# Patient Record
Sex: Female | Born: 1995 | Race: Black or African American | Hispanic: No | Marital: Married
Health system: Midwestern US, Community
[De-identification: ages and names within clinical notes are randomized; demographics above are authoritative.]

## PROBLEM LIST (undated history)

## (undated) ENCOUNTER — Inpatient Hospital Stay (HOSPITAL_COMMUNITY): Payer: Self-pay

## (undated) DIAGNOSIS — K219 Gastro-esophageal reflux disease without esophagitis: Secondary | ICD-10-CM

## (undated) DIAGNOSIS — D649 Anemia, unspecified: Secondary | ICD-10-CM

## (undated) DIAGNOSIS — F419 Anxiety disorder, unspecified: Secondary | ICD-10-CM

## (undated) DIAGNOSIS — N83209 Unspecified ovarian cyst, unspecified side: Secondary | ICD-10-CM

## (undated) HISTORY — PX: WISDOM TOOTH EXTRACTION: SHX21

---
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
## 2015-05-03 ENCOUNTER — Emergency Department (HOSPITAL_BASED_OUTPATIENT_CLINIC_OR_DEPARTMENT_OTHER)
Admission: EM | Admit: 2015-05-03 | Discharge: 2015-05-03 | Disposition: A | Discharge status: Home / Self Care | Payer: Medicaid Other | Attending: Emergency Medicine | Admitting: Emergency Medicine

## 2015-05-03 ENCOUNTER — Encounter (HOSPITAL_BASED_OUTPATIENT_CLINIC_OR_DEPARTMENT_OTHER): Payer: Self-pay | Admitting: *Deleted

## 2015-05-03 ENCOUNTER — Emergency Department (HOSPITAL_BASED_OUTPATIENT_CLINIC_OR_DEPARTMENT_OTHER): Payer: Medicaid Other

## 2015-05-03 DIAGNOSIS — F410 Panic disorder [episodic paroxysmal anxiety] without agoraphobia: Secondary | ICD-10-CM | POA: Diagnosis not present

## 2015-05-03 DIAGNOSIS — Z79899 Other long term (current) drug therapy: Secondary | ICD-10-CM | POA: Insufficient documentation

## 2015-05-03 DIAGNOSIS — Z720 Tobacco use: Secondary | ICD-10-CM | POA: Diagnosis not present

## 2015-05-03 DIAGNOSIS — R52 Pain, unspecified: Secondary | ICD-10-CM

## 2015-05-03 DIAGNOSIS — R079 Chest pain, unspecified: Secondary | ICD-10-CM | POA: Diagnosis present

## 2015-05-03 LAB — BASIC METABOLIC PANEL
Anion gap: 7 (ref 5–15)
BUN: 10 mg/dL (ref 6–20)
CALCIUM: 8.7 mg/dL — AB (ref 8.9–10.3)
CO2: 25 mmol/L (ref 22–32)
CREATININE: 0.71 mg/dL (ref 0.44–1.00)
Chloride: 106 mmol/L (ref 101–111)
GFR calc non Af Amer: >60 mL/min (ref >60)
GLUCOSE: 89 mg/dL (ref 65–99)
Potassium: 3.8 mmol/L (ref 3.5–5.1)
Sodium: 138 mmol/L (ref 135–145)

## 2015-05-03 LAB — CBC WITH DIFFERENTIAL/PLATELET
BASOS ABS: 0 10*3/uL (ref 0.0–0.1)
Basophils Relative: 0 % (ref 0–1)
EOS ABS: 0.1 10*3/uL (ref 0.0–0.7)
Eosinophils Relative: 1 % (ref 0–5)
HCT: 36.9 % (ref 36.0–46.0)
Hemoglobin: 11.8 g/dL — ABNORMAL LOW (ref 12.0–15.0)
LYMPHS ABS: 1.3 10*3/uL (ref 0.7–4.0)
Lymphocytes Relative: 27 % (ref 12–46)
MCH: 28.8 pg (ref 26.0–34.0)
MCHC: 32 g/dL (ref 30.0–36.0)
MCV: 90 fL (ref 78.0–100.0)
Monocytes Absolute: 0.4 10*3/uL (ref 0.1–1.0)
Monocytes Relative: 7 % (ref 3–12)
NEUTROS ABS: 3.2 10*3/uL (ref 1.7–7.7)
NEUTROS PCT: 65 % (ref 43–77)
PLATELETS: 219 10*3/uL (ref 150–400)
RBC: 4.1 MIL/uL (ref 3.87–5.11)
RDW: 13.3 % (ref 11.5–15.5)
WBC: 4.9 10*3/uL (ref 4.0–10.5)

## 2015-05-03 LAB — PREGNANCY, URINE: Preg Test, Ur: NEGATIVE

## 2015-05-03 MED ORDER — HYDROXYZINE HCL 25 MG PO TABS
50.0000 mg | ORAL_TABLET | Freq: Once | ORAL | Status: AC
  Administered 2015-05-03: 50 mg via ORAL
  Filled 2015-05-03: qty 2

## 2015-05-03 MED ORDER — HYDROXYZINE HCL 25 MG PO TABS: 25.0000 mg | ORAL_TABLET | Freq: Four times a day (QID) | ORAL | Status: DC

## 2015-05-03 NOTE — Discharge Instructions (Signed)
Please follow with your primary care doctor in the next 2 days for a check-up. They must obtain records for further management.  ° °Do not hesitate to return to the Emergency Department for any new, worsening or concerning symptoms.  ° °

## 2015-05-03 NOTE — ED Provider Notes (Signed)
CSN: [id number]     Arrival date & time 05/03/15  1445 History   First MD Initiated Contact with Patient 05/03/15 1607     Chief Complaint  Patient presents with  . Panic Attack     (Consider location/radiation/quality/duration/timing/severity/associated sxs/prior Treatment) HPI  Blood pressure 103/74, pulse 72, temperature 98.5 F (36.9 C), temperature source Oral, resp. rate 18, height  (1.651 m), weight 125 lb (56.7 kg), last menstrual period 04/29/2015, SpO2 100 %.  Natasha Good is a 19 y.o. female complaining of chest pain and shortness of breath, sense of impending doom onset 2 hours ago, this was unprovoked similar to prior panic attacks. Patient states that she's had these and has not discussed them with her primary care doctor, she's not taking any medications for it. She states that she is not any more particular stress today than any other day. She denies depression, suicidal ideation, homicidal ideation, drug abuse. She states that the shortness of breath has resolved however, the chest pain continues, its 5 out of 10, retrosternal and nonradiating. Pt denies fever, family history of early cardiac death h/o DVT/PE, calf pain or leg swelling, hemoptysis, recent immobilization, cancer/chemotherapy in the last 6 months, exogenous estrogen (chart says she takes a depo shot but patient states she has not had this in over a year.). On review of systems she endorses a dry cough.   History reviewed. No pertinent past medical history. History reviewed. No pertinent past surgical history. Family History  Problem Relation Age of Onset  . Heart disease Father    History  Substance Use Topics  . Smoking status: Never Smoker   . Smokeless tobacco: Never Used  . Alcohol Use: No   OB History    No data available     Review of Systems    Allergies  Review of patient's allergies indicates no known allergies.  Home Medications   Prior to Admission medications   Medication  Sig Start Date End Date Taking? Authorizing Provider  acetaminophen (TYLENOL) 500 MG tablet Take 500 mg by mouth every 6 (six) hours as needed.    Historical Provider, MD  hydrOXYzine (ATARAX/VISTARIL) 25 MG tablet Take 1 tablet (25 mg total) by mouth every 6 (six) hours. 05/03/15   Natasha Bonczek, PA-C  medroxyPROGESTERone (DEPO-PROVERA) 150 MG/ML injection Inject 1 mL (150 mg total) into the muscle every 3 (three) months. 04/24/13   Natasha Mocha, MD  ondansetron (ZOFRAN-ODT) 4 MG disintegrating tablet Take 1 tablet (4 mg total) by mouth every 6 (six) hours as needed for nausea or vomiting. 01/25/14   Natasha Foster, NP  polyethylene glycol powder (GLYCOLAX/MIRALAX) powder 17g in 8 ounces of clear liquids PO QHS x 1 week then PRN 01/25/14   Natasha Foster, NP  Skin Protectants, Misc. (A+D FIRST AID) OINT Apply 1 application topically daily. Apply to tattoo daily    Historical Provider, MD   BP 115/71 mmHg  Pulse 82  Temp(Src) 98.5 F (36.9 C) (Oral)  Resp 16  Ht  (1.651 m)  Wt 125 lb (56.7 kg)  BMI 20.80 kg/m2  SpO2 100%  LMP 04/29/2015 Physical Exam  Constitutional: She is oriented to person, place, and time. She appears well-developed and well-nourished. No distress.  HENT:  Head: Normocephalic.  Mouth/Throat: Oropharynx is clear and moist.  Eyes: Conjunctivae are normal.  Neck: Normal range of motion. No JVD present. No tracheal deviation present.  Cardiovascular: Normal rate, regular rhythm and intact distal pulses.   Radial pulse equal  bilaterally  Pulmonary/Chest: Effort normal and breath sounds normal. No stridor. No respiratory distress. She has no wheezes. She has no rales. She exhibits no tenderness.  Abdominal: Soft. She exhibits no distension and no mass. There is no tenderness. There is no rebound and no guarding.  Musculoskeletal: Normal range of motion. She exhibits no edema or tenderness.  No calf asymmetry, superficial collaterals, palpable cords, edema, Homans sign  negative bilaterally.    Neurological: She is alert and oriented to person, place, and time.  Skin: Skin is warm. She is not diaphoretic.  Psychiatric:  Calm, tearful  Nursing note and vitals reviewed.   ED Course  Procedures (including critical care time) Labs Review Labs Reviewed  CBC WITH DIFFERENTIAL/PLATELET - Abnormal; Notable for the following:    Hemoglobin 11.8 (*)    All other components within normal limits  BASIC METABOLIC PANEL - Abnormal; Notable for the following:    Calcium 8.7 (*)    All other components within normal limits  PREGNANCY, URINE    Imaging Review Dg Chest 2 View  05/03/2015   CLINICAL DATA:  19 year old female with panic attack  EXAM: CHEST  2 VIEW  COMPARISON:  Radiograph dated 05/09/2014  FINDINGS: The heart size and mediastinal contours are within normal limits. Both lungs are clear. The visualized skeletal structures are unremarkable.  IMPRESSION: No active cardiopulmonary disease.   Electronically Signed   By: Natasha CollardArash  Good M.D.   On: 05/03/2015 17:33     EKG Interpretation   Date/Time:  Sunday May 03 2015 16:38:26 EDT Ventricular Rate:  72 PR Interval:  164 QRS Duration: 72 QT Interval:  392 QTC Calculation: 429 R Axis:   75 Text Interpretation:  Normal sinus rhythm with sinus arrhythmia Normal ECG  No significant change since last tracing Confirmed by Natasha ChickJACUBOWITZ  MD, Natasha  [phone]([id number]) on 05/03/2015 4:40:38 PM      MDM   Final diagnoses:  Pain  Panic attack    Filed Vitals:   05/03/15 1504 05/03/15 1750  BP: 103/74 115/71  Pulse: 72 82  Temp: 98.5 F (36.9 C)   TempSrc: Oral   Resp: 18 16  Height: 5\' 5"  (1.651 m)   Weight: 125 lb (56.7 kg)   SpO2: 100% 100%    Medications  hydrOXYzine (ATARAX/VISTARIL) tablet 50 mg (50 mg Oral Given 05/03/15 1644)    Natasha Good is a pleasant 19 y.o. female presenting with chest pain shortness of breath consistent with prior episodes of panic attack, will check basic cardiac  workup.  EKG with no arrhythmia, bloodwork does not show any anemia, chest x-rays negative, reassured patient that this is a simple anxiety attack. I encouraged her to read up on the condition and to follow closely with her PCP.  Evaluation does not show pathology that would require ongoing emergent intervention or inpatient treatment. Pt is hemodynamically stable and mentating appropriately. Discussed findings and plan with patient/guardian, who agrees with care plan. All questions answered. Return precautions discussed and outpatient follow up given.   New Prescriptions   HYDROXYZINE (ATARAX/VISTARIL) 25 MG TABLET    Take 1 tablet (25 mg total) by mouth every 6 (six) hours.         Wynetta Emeryicole Chevy Virgo, PA-C 05/03/15 1806  Doug SouSam Jacubowitz, MD 05/04/15 [phone]

## 2015-05-03 NOTE — ED Notes (Signed)
Pt reports panic attack x 2 hours- has had similar episodes in the past but has never be evaluated- no meds- denies SI/HI

## 2015-12-06 ENCOUNTER — Emergency Department (HOSPITAL_BASED_OUTPATIENT_CLINIC_OR_DEPARTMENT_OTHER)
Admission: EM | Admit: 2015-12-06 | Discharge: 2015-12-06 | Disposition: A | Discharge status: Home / Self Care | Payer: Medicaid Other | Attending: Emergency Medicine | Admitting: Emergency Medicine

## 2015-12-06 ENCOUNTER — Encounter (HOSPITAL_BASED_OUTPATIENT_CLINIC_OR_DEPARTMENT_OTHER): Payer: Self-pay | Admitting: *Deleted

## 2015-12-06 DIAGNOSIS — D649 Anemia, unspecified: Secondary | ICD-10-CM | POA: Diagnosis not present

## 2015-12-06 DIAGNOSIS — R040 Epistaxis: Secondary | ICD-10-CM | POA: Diagnosis present

## 2015-12-06 DIAGNOSIS — Z79899 Other long term (current) drug therapy: Secondary | ICD-10-CM | POA: Insufficient documentation

## 2015-12-06 HISTORY — DX: Anemia, unspecified: D64.9

## 2015-12-06 LAB — CBC WITH DIFFERENTIAL/PLATELET
BASOS ABS: 0 10*3/uL (ref 0.0–0.1)
BASOS PCT: 0 %
Eosinophils Absolute: 0.3 10*3/uL (ref 0.0–0.7)
Eosinophils Relative: 8 %
HCT: 37.9 % (ref 36.0–46.0)
HEMOGLOBIN: 11.7 g/dL — AB (ref 12.0–15.0)
Lymphocytes Relative: 45 %
Lymphs Abs: 1.8 10*3/uL (ref 0.7–4.0)
MCH: 28 pg (ref 26.0–34.0)
MCHC: 30.9 g/dL (ref 30.0–36.0)
MCV: 90.7 fL (ref 78.0–100.0)
Monocytes Absolute: 0.3 10*3/uL (ref 0.1–1.0)
Monocytes Relative: 8 %
NEUTROS PCT: 39 %
Neutro Abs: 1.6 10*3/uL — ABNORMAL LOW (ref 1.7–7.7)
Platelets: 197 10*3/uL (ref 150–400)
RBC: 4.18 MIL/uL (ref 3.87–5.11)
RDW: 12.7 % (ref 11.5–15.5)
WBC: 4.1 10*3/uL (ref 4.0–10.5)

## 2015-12-06 MED ORDER — FERROUS SULFATE 325 (65 FE) MG PO TABS: 325.0000 mg | ORAL_TABLET | ORAL | Status: DC

## 2015-12-06 NOTE — ED Provider Notes (Signed)
CSN: [id number]     Arrival date & time 12/06/15  1146 History   First MD Initiated Contact with Patient 12/06/15 1223     Chief Complaint  Patient presents with  . Epistaxis   (Consider location/radiation/quality/duration/timing/severity/associated sxs/prior Treatment) Patient is a 20 y.o. female presenting with nosebleeds. The history is provided by the patient. No language interpreter was used.  Epistaxis Associated symptoms: no fever    Miss Mihalic is a 20 year old female with a history of anemia and nosebleeds as a child who presents for 4 episodes of epistaxis in the past 3 days. She states it resolved after applying pressure and tilting her head back. She reports being dizzy while at work yesterday but says that has since resolved and she just has a headache now. Says that her mom was worried due to her history of anemia and sent her to the ED. Denies any history of bleeding disorders. Denies any heavy menstrual periods. Last menstrual period was 11/15/2015. Denies any bleeding from the gums, lightheadedness, syncope. Denies any injury to the face or use of illegal nasal drugs.   Past Medical History  Diagnosis Date  . Anemia    History reviewed. No pertinent past surgical history. Family History  Problem Relation Age of Onset  . Heart disease Father    Social History  Substance Use Topics  . Smoking status: Never Smoker   . Smokeless tobacco: Never Used  . Alcohol Use: No   OB History    No data available     Review of Systems  Constitutional: Negative for fever.  HENT: Positive for nosebleeds.   All other systems reviewed and are negative.     Allergies  Review of patient's allergies indicates no known allergies.  Home Medications   Prior to Admission medications   Medication Sig Start Date End Date Taking? Authorizing Provider  acetaminophen (TYLENOL) 500 MG tablet Take 500 mg by mouth every 6 (six) hours as needed.    Historical Provider, MD  ferrous  sulfate 325 (65 FE) MG tablet Take 1 tablet (325 mg total) by mouth every other day. 12/06/15   Imoni Kohen Patel-Mills, PA-C  hydrOXYzine (ATARAX/VISTARIL) 25 MG tablet Take 1 tablet (25 mg total) by mouth every 6 (six) hours. 05/03/15   Nicole Pisciotta, PA-C  medroxyPROGESTERone (DEPO-PROVERA) 150 MG/ML injection Inject 1 mL (150 mg total) into the muscle every 3 (three) months. 04/24/13   Sherren Mocha, MD  ondansetron (ZOFRAN-ODT) 4 MG disintegrating tablet Take 1 tablet (4 mg total) by mouth every 6 (six) hours as needed for nausea or vomiting. 01/25/14   Lowanda Foster, NP  polyethylene glycol powder (GLYCOLAX/MIRALAX) powder 17g in 8 ounces of clear liquids PO QHS x 1 week then PRN 01/25/14   Lowanda Foster, NP  Skin Protectants, Misc. (A+D FIRST AID) OINT Apply 1 application topically daily. Apply to tattoo daily    Historical Provider, MD   BP 116/77 mmHg  Pulse 66  Temp(Src) 98.5 F (36.9 C) (Oral)  Resp 16  Ht  (1.651 m)  Wt 49.896 kg  BMI 18.31 kg/m2  SpO2 100%  LMP 11/15/2015 (Approximate) Physical Exam  Constitutional: She is oriented to person, place, and time. She appears well-developed and well-nourished. No distress.  HENT:  Head: Normocephalic and atraumatic.  Patent nares with no active bleeding or dried blood. Normal septum. No bleeding in the throat.  Eyes: Conjunctivae and EOM are normal.  Neck: Normal range of motion. Neck supple.  Cardiovascular: Normal rate.  Pulmonary/Chest: Effort normal.  Musculoskeletal: Normal range of motion.  Neurological: She is alert and oriented to person, place, and time.  Skin: Skin is warm and dry.  Psychiatric: She has a normal mood and affect.  Nursing note and vitals reviewed.   ED Course  Procedures (including critical care time) Labs Review Labs Reviewed  CBC WITH DIFFERENTIAL/PLATELET - Abnormal; Notable for the following:    Hemoglobin 11.7 (*)    Neutro Abs 1.6 (*)    All other components within normal limits    Imaging  Review No results found. I have personally reviewed and evaluated these lab results as part of my medical decision-making.   EKG Interpretation None      MDM   Final diagnoses:  Anemia, unspecified anemia type   Patient presents for epistaxis 4 for the past 3 days. Able to be controlled through pressure and tilting her head. Worried that she may be anemic since she has had dizziness yesterday that has since resolved. Reports that mom sent her for further evaluation. She is well-appearing and in no acute distress. No active bleeding now. Denies heavy menstrual cycles. Vital signs stable. CBC was obtained and patient is mildly anemic with hemoglobin of 11.7. This is comparable to her previous hemoglobin level. Return precautions were discussed as well as follow-up. Patient was put on ferrous iron. Patient agrees with plan. Filed Vitals:   12/06/15 1151 12/06/15 1316  BP: 115/72 116/77  Pulse: 65 66  Temp: 98.5 F (36.9 C)   Resp: 18 [id number], PA-C 12/06/15 1519  Lyndal Pulley, MD 12/07/15 1330

## 2015-12-06 NOTE — ED Notes (Signed)
Rt and Lt nare visually examined, no blood or bleeding noted at this time, pt appears to be in no distress at this time. Color good, capillary refill WNL. Alert and oriented x 3.

## 2015-12-06 NOTE — ED Notes (Signed)
Reports nosebleed this morning which is resolved at present. States she has had 4 nosebleeds since Thursday. Also reports hx of anemia and at times her hands and feet are purple

## 2015-12-06 NOTE — ED Notes (Signed)
Pt states has been having nosebleeds since Thursday, worse yesterday, no active bleeding noted at this time. No notice of bleeding since this am

## 2015-12-06 NOTE — Discharge Instructions (Signed)
Anemia, Nonspecific Hemoglobin 11.7. Take iron every other day. This may cause constipation. In that event, take a stool softener. Follow-up with your doctor in 2 days. Anemia is a condition in which the concentration of red blood cells or hemoglobin in the blood is below normal. Hemoglobin is a substance in red blood cells that carries oxygen to the tissues of the body. Anemia results in not enough oxygen reaching these tissues.  CAUSES  Common causes of anemia include:   Excessive bleeding. Bleeding may be internal or external. This includes excessive bleeding from periods (in women) or from the intestine.   Poor nutrition.   Chronic kidney, thyroid, and liver disease.  Bone marrow disorders that decrease red blood cell production.  Cancer and treatments for cancer.  HIV, AIDS, and their treatments.  Spleen problems that increase red blood cell destruction.  Blood disorders.  Excess destruction of red blood cells due to infection, medicines, and autoimmune disorders. SIGNS AND SYMPTOMS   Minor weakness.   Dizziness.   Headache.  Palpitations.   Shortness of breath, especially with exercise.   Paleness.  Cold sensitivity.  Indigestion.  Nausea.  Difficulty sleeping.  Difficulty concentrating. Symptoms may occur suddenly or they may develop slowly.  DIAGNOSIS  Additional blood tests are often needed. These help your health care provider determine the best treatment. Your health care provider will check your stool for blood and look for other causes of blood loss.  TREATMENT  Treatment varies depending on the cause of the anemia. Treatment can include:   Supplements of iron, vitamin B12, or folic acid.   Hormone medicines.   A blood transfusion. This may be needed if blood loss is severe.   Hospitalization. This may be needed if there is significant continual blood loss.   Dietary changes.  Spleen removal. HOME CARE INSTRUCTIONS Keep all  follow-up appointments. It often takes many weeks to correct anemia, and having your health care provider check on your condition and your response to treatment is very important. SEEK IMMEDIATE MEDICAL CARE IF:   You develop extreme weakness, shortness of breath, or chest pain.   You become dizzy or have trouble concentrating.  You develop heavy vaginal bleeding.   You develop a rash.   You have bloody or black, tarry stools.   You faint.   You vomit up blood.   You vomit repeatedly.   You have abdominal pain.  You have a fever or persistent symptoms for more than 2-3 days.   You have a fever and your symptoms suddenly get worse.   You are dehydrated.  MAKE SURE YOU:  Understand these instructions.  Will watch your condition.  Will get help right away if you are not doing well or get worse.   This information is not intended to replace advice given to you by your health care provider. Make sure you discuss any questions you have with your health care provider.   Document Released: 11/10/2004 Document Revised: 06/05/2013 Document Reviewed: 03/29/2013 Elsevier Interactive Patient Education [employer]

## 2016-02-04 LAB — OB RESULTS CONSOLE GBS: STREP GROUP B AG: NEGATIVE

## 2016-02-04 LAB — OB RESULTS CONSOLE GC/CHLAMYDIA
CHLAMYDIA, DNA PROBE: NEGATIVE
Gonorrhea: NEGATIVE

## 2016-07-04 ENCOUNTER — Ambulatory Visit (INDEPENDENT_AMBULATORY_CARE_PROVIDER_SITE_OTHER): Payer: Medicaid Other | Admitting: General Practice

## 2016-07-04 ENCOUNTER — Encounter: Payer: Self-pay | Admitting: General Practice

## 2016-07-04 DIAGNOSIS — Z3201 Encounter for pregnancy test, result positive: Secondary | ICD-10-CM | POA: Diagnosis not present

## 2016-07-04 LAB — POCT PREGNANCY, URINE: Preg Test, Ur: POSITIVE — AB

## 2016-07-04 NOTE — Progress Notes (Signed)
Patient here for UPT today. UPT +. First positive home test was today. LMP sometime in mid August EDD estimate 03/07/16. Patient not currently on any meds or vitamins. Recommended she begin PNV & provided proof of pregnancy letter. Recommended she seek care for first visit in 5-6 weeks. Patient verbalized understanding to all

## 2016-07-05 ENCOUNTER — Inpatient Hospital Stay (HOSPITAL_COMMUNITY)
Admission: AD | Admit: 2016-07-05 | Discharge: 2016-07-05 | Disposition: A | Discharge status: Home / Self Care | Payer: Medicaid Other | Source: Ambulatory Visit | Attending: Obstetrics and Gynecology | Admitting: Obstetrics and Gynecology

## 2016-07-05 ENCOUNTER — Inpatient Hospital Stay (HOSPITAL_COMMUNITY): Payer: Medicaid Other

## 2016-07-05 ENCOUNTER — Encounter (HOSPITAL_COMMUNITY): Payer: Self-pay | Admitting: *Deleted

## 2016-07-05 DIAGNOSIS — O9989 Other specified diseases and conditions complicating pregnancy, childbirth and the puerperium: Secondary | ICD-10-CM

## 2016-07-05 DIAGNOSIS — R109 Unspecified abdominal pain: Secondary | ICD-10-CM

## 2016-07-05 DIAGNOSIS — O26899 Other specified pregnancy related conditions, unspecified trimester: Secondary | ICD-10-CM

## 2016-07-05 DIAGNOSIS — Z3491 Encounter for supervision of normal pregnancy, unspecified, first trimester: Secondary | ICD-10-CM

## 2016-07-05 DIAGNOSIS — R1031 Right lower quadrant pain: Secondary | ICD-10-CM | POA: Diagnosis not present

## 2016-07-05 DIAGNOSIS — Z3A01 Less than 8 weeks gestation of pregnancy: Secondary | ICD-10-CM | POA: Insufficient documentation

## 2016-07-05 DIAGNOSIS — O26891 Other specified pregnancy related conditions, first trimester: Secondary | ICD-10-CM | POA: Diagnosis not present

## 2016-07-05 HISTORY — DX: Gastro-esophageal reflux disease without esophagitis: K21.9

## 2016-07-05 HISTORY — DX: Unspecified ovarian cyst, unspecified side: N83.209

## 2016-07-05 HISTORY — DX: Anxiety disorder, unspecified: F41.9

## 2016-07-05 LAB — CBC
HEMATOCRIT: 37.7 % (ref 36.0–46.0)
HEMOGLOBIN: 12.6 g/dL (ref 12.0–15.0)
MCH: 29.9 pg (ref 26.0–34.0)
MCHC: 33.4 g/dL (ref 30.0–36.0)
MCV: 89.5 fL (ref 78.0–100.0)
Platelets: 241 10*3/uL (ref 150–400)
RBC: 4.21 MIL/uL (ref 3.87–5.11)
RDW: 14.1 % (ref 11.5–15.5)
WBC: 7.1 10*3/uL (ref 4.0–10.5)

## 2016-07-05 LAB — HCG, QUANTITATIVE, PREGNANCY: HCG, BETA CHAIN, QUANT, S: 6458 m[IU]/mL — AB (ref <5)

## 2016-07-05 LAB — WET PREP, GENITAL
Sperm: NONE SEEN
Trich, Wet Prep: NONE SEEN
WBC WET PREP: NONE SEEN
Yeast Wet Prep HPF POC: NONE SEEN

## 2016-07-05 LAB — URINALYSIS, ROUTINE W REFLEX MICROSCOPIC
Bilirubin Urine: NEGATIVE
GLUCOSE, UA: NEGATIVE mg/dL
HGB URINE DIPSTICK: NEGATIVE
Ketones, ur: NEGATIVE mg/dL
LEUKOCYTES UA: NEGATIVE
Nitrite: NEGATIVE
PH: 6 (ref 5.0–8.0)
PROTEIN: NEGATIVE mg/dL
Specific Gravity, Urine: 1.015 (ref 1.005–1.030)

## 2016-07-05 LAB — ABO/RH: ABO/RH(D): A NEG

## 2016-07-05 LAB — OB RESULTS CONSOLE GC/CHLAMYDIA: GC PROBE AMP, GENITAL: NEGATIVE

## 2016-07-05 NOTE — MAU Provider Note (Signed)
History     CSN: [id number]  Arrival date and time: 07/05/16 1107   First Provider Initiated Contact with Patient 07/05/16 1352      Chief Complaint  Patient presents with  . Abdominal Pain   HPI  Natasha Good is a 20 y.o. G1P0 at 2357w0d by LMP who presents with abdominal pain. Reports lower abdominal pain x 1 week. Pain is worse in RLQ. Comes & goes. Currently rates pain 5/10. Has been using a heating pad with minimal relief. Denies aggravating factors.  Denies vomiting, diarrhea, constipation, vaginal bleeding, fever, or dysuria. Reports some nausea. No recent intercourse.   OB History    Gravida Para Term Preterm AB Living   1         0   SAB TAB Ectopic Multiple Live Births                  Past Medical History:  Diagnosis Date  . Acid reflux   . Anemia   . Anxiety   . Ovarian cyst     Past Surgical History:  Procedure Laterality Date  . WISDOM TOOTH EXTRACTION      Family History  Problem Relation Age of Onset  . Heart disease Father     Social History  Substance Use Topics  . Smoking status: Never Smoker  . Smokeless tobacco: Never Used  . Alcohol use No    Allergies: No Known Allergies  Prescriptions Prior to Admission  Medication Sig Dispense Refill Last Dose  . acetaminophen (TYLENOL) 500 MG tablet Take 500 mg by mouth every 6 (six) hours as needed.   01/25/2014 at Unknown time  . ferrous sulfate 325 (65 FE) MG tablet Take 1 tablet (325 mg total) by mouth every other day. 30 tablet 0   . hydrOXYzine (ATARAX/VISTARIL) 25 MG tablet Take 1 tablet (25 mg total) by mouth every 6 (six) hours. 12 tablet 0   . medroxyPROGESTERone (DEPO-PROVERA) 150 MG/ML injection Inject 1 mL (150 mg total) into the muscle every 3 (three) months. 1 mL 3 Last month  . ondansetron (ZOFRAN-ODT) 4 MG disintegrating tablet Take 1 tablet (4 mg total) by mouth every 6 (six) hours as needed for nausea or vomiting. 10 tablet 0   . polyethylene glycol powder (GLYCOLAX/MIRALAX) powder  17g in 8 ounces of clear liquids PO QHS x 1 week then PRN 255 g 0   . Skin Protectants, Misc. (A+D FIRST AID) OINT Apply 1 application topically daily. Apply to tattoo daily   01/25/2014 at Unknown time    Review of Systems  Constitutional: Negative.   Gastrointestinal: Positive for abdominal pain and nausea. Negative for constipation, diarrhea and vomiting.  Genitourinary: Negative.    Physical Exam   Blood pressure 119/69, pulse 70, temperature 98.5 F (36.9 C), temperature source Oral, resp. rate 16, height 5\' 5"  (1.651 m), weight 119 lb (54 kg), last menstrual period 05/24/2016.  Physical Exam  Nursing note and vitals reviewed. Constitutional: She is oriented to person, place, and time. She appears well-developed and well-nourished. No distress.  HENT:  Head: Normocephalic and atraumatic.  Eyes: Conjunctivae are normal. Right eye exhibits no discharge. Left eye exhibits no discharge. No scleral icterus.  Neck: Normal range of motion.  Cardiovascular: Normal rate, regular rhythm and normal heart sounds.   No murmur heard. Respiratory: Effort normal and breath sounds normal. No respiratory distress. She has no wheezes.  GI: Soft. She exhibits no distension. There is no tenderness.  Genitourinary: There is no  rash or tenderness on the right labia. There is no rash or tenderness on the left labia.  Neurological: She is alert and oriented to person, place, and time.  Skin: Skin is warm and dry. She is not diaphoretic.  Psychiatric: She has a normal mood and affect. Her behavior is normal. Judgment and thought content normal.    MAU Course  Procedures Results for orders placed or performed during the hospital encounter of 07/05/16 (from the past 24 hour(s))  hCG, quantitative, pregnancy     Status: Abnormal   Collection Time: 07/05/16 11:28 AM  Result Value Ref Range   hCG, Beta Chain, Quant, S 6,458 (H) <5 mIU/mL  ABO/Rh     Status: None (Preliminary result)   Collection Time:  07/05/16  1:35 PM  Result Value Ref Range   ABO/RH(D) A NEG    US Ob Comp Less 14 Wks  Result Date: 07/05/2016 CLINICAL DATA:  Right lower quadrant/pelvic pain for 1 week EXAM: OBSTETRIC <14 WK [place] AND TRANSVAGINAL OB US TECHNIQUE: Both transabdominal and transvaginal ultrasound examinations were performed for complete evaluation of the gestation as well as the maternal uterus, adnexal regions, and pelvic cul-de-sac. Transvaginal technique was performed to assess early pregnancy. COMPARISON:  None. FINDINGS: Intrauterine gestational sac: Visualized Yolk sac:  Visualized Embryo:  Not visualized Cardiac Activity: Not visualized MSD: 6  mm   5 w   2  d Subchorionic hemorrhage:  None visualized. Maternal uterus/adnexae: Cervical os is closed. Right ovary appears normal. There is a 3.3 x 2.4 x 3.4 cm probable corpus luteum arising from the left ovary. No other extrauterine pelvic mass. A small amount of free fluid is noted in the cul-de-sac. IMPRESSION: There is a gestational sac containing a yolk sac, but no fetal pole currently visualized. Based on gestational sac size, estimated gestational age is 5+ weeks. Advise follow-up ultrasound in approximately 14 days to assess for fetal pole and to assess for fetal viability. Uterus otherwise appears unremarkable. No subchorionic hemorrhage seen. Probable prominent corpus luteum arising from left ovary. Small amount of free pelvic fluid is of uncertain etiology. This finding potentially could indicate recent ovarian cyst leakage. Electronically Signed   By: Bretta Bang III M.D.   On: 07/05/2016 15:17   US Ob Transvaginal  Result Date: 07/05/2016 CLINICAL DATA:  Right lower quadrant/pelvic pain for 1 week EXAM: OBSTETRIC <14 WK [place] AND TRANSVAGINAL OB US TECHNIQUE: Both transabdominal and transvaginal ultrasound examinations were performed for complete evaluation of the gestation as well as the maternal uterus, adnexal regions, and pelvic cul-de-sac. Transvaginal  technique was performed to assess early pregnancy. COMPARISON:  None. FINDINGS: Intrauterine gestational sac: Visualized Yolk sac:  Visualized Embryo:  Not visualized Cardiac Activity: Not visualized MSD: 6  mm   5 w   2  d Subchorionic hemorrhage:  None visualized. Maternal uterus/adnexae: Cervical os is closed. Right ovary appears normal. There is a 3.3 x 2.4 x 3.4 cm probable corpus luteum arising from the left ovary. No other extrauterine pelvic mass. A small amount of free fluid is noted in the cul-de-sac. IMPRESSION: There is a gestational sac containing a yolk sac, but no fetal pole currently visualized. Based on gestational sac size, estimated gestational age is 5+ weeks. Advise follow-up ultrasound in approximately 14 days to assess for fetal pole and to assess for fetal viability. Uterus otherwise appears unremarkable. No subchorionic hemorrhage seen. Probable prominent corpus luteum arising from left ovary. Small amount of free pelvic fluid is of uncertain  etiology. This finding potentially could indicate recent ovarian cyst leakage. Electronically Signed   By: Bretta Bang III M.D.   On: 07/05/2016 15:17    MDM +UPT UA, wet prep, GC/chlamydia, CBC, ABO/Rh, quant hCG, HIV, and [place] today to rule out ectopic pregnancy Ultrasound shows IUGS with yolk sac  Assessment and Plan  A: 1. Normal IUP (intrauterine pregnancy) on prenatal ultrasound, first trimester   2. Abdominal pain affecting pregnancy     P: Discharge home F/u ultrasound for viability ordered Pt to call [facility] for prenatal appt Discussed reasons to return to MAU  Judeth Horn 07/05/2016, 1:52 PM

## 2016-07-05 NOTE — Discharge Instructions (Signed)

## 2016-07-05 NOTE — MAU Note (Signed)
Pt had pos UPT yesterday, C/O RLQ pain for the past week.  Denies bleeding.

## 2016-07-06 LAB — HIV ANTIBODY (ROUTINE TESTING W REFLEX): HIV Screen 4th Generation wRfx: NONREACTIVE

## 2016-07-06 LAB — GC/CHLAMYDIA PROBE AMP (~~LOC~~) NOT AT ARMC
Chlamydia: NEGATIVE
Neisseria Gonorrhea: NEGATIVE

## 2016-07-10 ENCOUNTER — Inpatient Hospital Stay (HOSPITAL_COMMUNITY): Payer: Medicaid Other

## 2016-07-10 ENCOUNTER — Inpatient Hospital Stay (HOSPITAL_COMMUNITY)
Admission: AD | Admit: 2016-07-10 | Discharge: 2016-07-11 | Disposition: A | Discharge status: Home / Self Care | Payer: Medicaid Other | Source: Ambulatory Visit | Attending: Obstetrics & Gynecology | Admitting: Obstetrics & Gynecology

## 2016-07-10 ENCOUNTER — Encounter (HOSPITAL_COMMUNITY): Payer: Self-pay | Admitting: *Deleted

## 2016-07-10 DIAGNOSIS — R109 Unspecified abdominal pain: Secondary | ICD-10-CM | POA: Diagnosis not present

## 2016-07-10 DIAGNOSIS — R1030 Lower abdominal pain, unspecified: Secondary | ICD-10-CM | POA: Insufficient documentation

## 2016-07-10 DIAGNOSIS — Z3A01 Less than 8 weeks gestation of pregnancy: Secondary | ICD-10-CM | POA: Insufficient documentation

## 2016-07-10 DIAGNOSIS — O26891 Other specified pregnancy related conditions, first trimester: Secondary | ICD-10-CM | POA: Diagnosis not present

## 2016-07-10 DIAGNOSIS — O26899 Other specified pregnancy related conditions, unspecified trimester: Secondary | ICD-10-CM

## 2016-07-10 DIAGNOSIS — N831 Corpus luteum cyst of ovary, unspecified side: Secondary | ICD-10-CM

## 2016-07-10 DIAGNOSIS — O9989 Other specified diseases and conditions complicating pregnancy, childbirth and the puerperium: Secondary | ICD-10-CM | POA: Diagnosis not present

## 2016-07-10 LAB — URINALYSIS, ROUTINE W REFLEX MICROSCOPIC
Bilirubin Urine: NEGATIVE
GLUCOSE, UA: NEGATIVE mg/dL
HGB URINE DIPSTICK: NEGATIVE
Ketones, ur: NEGATIVE mg/dL
LEUKOCYTES UA: NEGATIVE
Nitrite: NEGATIVE
Protein, ur: NEGATIVE mg/dL
SPECIFIC GRAVITY, URINE: 1.01 (ref 1.005–1.030)
pH: 6.5 (ref 5.0–8.0)

## 2016-07-10 LAB — CBC
HEMATOCRIT: 36 % (ref 36.0–46.0)
HEMOGLOBIN: 12 g/dL (ref 12.0–15.0)
MCH: 29.5 pg (ref 26.0–34.0)
MCHC: 33.3 g/dL (ref 30.0–36.0)
MCV: 88.5 fL (ref 78.0–100.0)
Platelets: 242 10*3/uL (ref 150–400)
RBC: 4.07 MIL/uL (ref 3.87–5.11)
RDW: 13.8 % (ref 11.5–15.5)
WBC: 7.7 10*3/uL (ref 4.0–10.5)

## 2016-07-10 NOTE — MAU Note (Signed)
Pt reports she has been having mid abd pain x 3 hours and since the pain started she "feels hot". Denies bleeding.

## 2016-07-11 DIAGNOSIS — R109 Unspecified abdominal pain: Secondary | ICD-10-CM | POA: Diagnosis not present

## 2016-07-11 DIAGNOSIS — O9989 Other specified diseases and conditions complicating pregnancy, childbirth and the puerperium: Secondary | ICD-10-CM

## 2016-07-11 DIAGNOSIS — Z3A01 Less than 8 weeks gestation of pregnancy: Secondary | ICD-10-CM | POA: Diagnosis not present

## 2016-07-11 MED ORDER — CONCEPT OB 130-92.4-1 MG PO CAPS: 1.0000 | ORAL_CAPSULE | Freq: Every day | ORAL | 12 refills | Status: DC

## 2016-07-11 NOTE — MAU Provider Note (Signed)
Chief Complaint: No chief complaint on file.   First Provider Initiated Contact with Patient 07/11/16 0111      SUBJECTIVE HPI: Natasha Good is a 20 y.o. G1P0 at [redacted]w[redacted]d who presents to Maternity Admissions reporting Low abd pain similar to what she was having last week that prompted her to come to MAU for evaluation. YS at that visit showed GS and YS, but no FP. Also feels hot. Has not taken her temperature.   Location: suprapubic. Quality: sharp  Severity: 3/10 on pain scale Duration: 1-2 weeks Context: none Timing: intermittent Modifying factors: none. Hasn't tried anything for the pain.  Associated signs and symptoms: Pos for feeing hot. Neg for N/V/D/C, vaginal bleeding, vaginal discharge.   GC/Chlamydia neg last week. Tx'd for BV.   Past Medical History:  Diagnosis Date  . Acid reflux   . Anemia   . Anxiety   . Ovarian cyst    OB History  Gravida Para Term Preterm AB Living  1         0  SAB TAB Ectopic Multiple Live Births               # Outcome Date GA Lbr Len/2nd Weight Sex Delivery Anes PTL Lv  1 Current              Past Surgical History:  Procedure Laterality Date  . WISDOM TOOTH EXTRACTION     Social History   Social History  . Marital status: Single    Spouse name: n/a  . Number of children: 0  . Years of education: N/A   Occupational History  . student     [employer]   Social History Main Topics  . Smoking status: Never Smoker  . Smokeless tobacco: Never Used  . Alcohol use No  . Drug use: No  . Sexual activity: No     Comment: last intercourse Sep 6   Other Topics Concern  . Not on file   Social History Narrative   Lives with her mother and younger sister.  Her father lives in [place].   No current facility-administered medications on file prior to encounter.    Current Outpatient Prescriptions on File Prior to Encounter  Medication Sig Dispense Refill  . acetaminophen (TYLENOL) 500 MG tablet Take 500 mg by mouth every 6  (six) hours as needed.    . ferrous sulfate 325 (65 FE) MG tablet Take 1 tablet (325 mg total) by mouth every other day. 30 tablet 0  . Skin Protectants, Misc. (A+D FIRST AID) OINT Apply 1 application topically daily. Apply to tattoo daily     No Known Allergies  I have reviewed the past Medical Hx, Surgical Hx, Social Hx, Allergies and Medications.   Review of Systems  Constitutional: Negative for appetite change and chills.  Gastrointestinal: Positive for abdominal pain. Negative for blood in stool, constipation, diarrhea, nausea and vomiting.  Genitourinary: Negative for dysuria, flank pain, frequency, hematuria, urgency, vaginal bleeding and vaginal discharge.  Musculoskeletal: Negative for back pain.    OBJECTIVE Patient Vitals for the past 24 hrs:  BP Temp Temp src Pulse Resp SpO2 Height Weight  07/10/16 2225 105/57 98.7 F (37.1 C) Oral 70 15 100 % 5\' 5"  (1.651 m) 119 lb (54 kg)   Constitutional: Well-developed, well-nourished female in no acute distress.  Cardiovascular: normal rate Respiratory: normal rate and effort.  GI: Abd soft, non-tender. Neurologic: Alert and oriented x 4.  GU: Neg CVAT.  SPECULUM EXAM:  Deferred  LAB RESULTS Results for orders placed or performed during the hospital encounter of 07/10/16 (from the past 24 hour(s))  Urinalysis, Routine w reflex microscopic (not at [facility])     Status: None   Collection Time: 07/10/16 10:23 PM  Result Value Ref Range   Color, Urine YELLOW YELLOW   APPearance CLEAR CLEAR   Specific Gravity, Urine 1.010 1.005 - 1.030   pH 6.5 5.0 - 8.0   Glucose, UA NEGATIVE NEGATIVE mg/dL   Hgb urine dipstick NEGATIVE NEGATIVE   Bilirubin Urine NEGATIVE NEGATIVE   Ketones, ur NEGATIVE NEGATIVE mg/dL   Protein, ur NEGATIVE NEGATIVE mg/dL   Nitrite NEGATIVE NEGATIVE   Leukocytes, UA NEGATIVE NEGATIVE  CBC     Status: None   Collection Time: 07/10/16 10:37 PM  Result Value Ref Range   WBC 7.7 4.0 - 10.5 K/uL   RBC 4.07 3.87 -  5.11 MIL/uL   Hemoglobin 12.0 12.0 - 15.0 g/dL   HCT 16.136.0 09.636.0 - 04.546.0 %   MCV 88.5 78.0 - 100.0 fL   MCH 29.5 26.0 - 34.0 pg   MCHC 33.3 30.0 - 36.0 g/dL   RDW 40.913.8 81.111.5 - 91.415.5 %   Platelets 242 150 - 400 K/uL    IMAGING [place] Ob Comp Less 14 Wks  Result Date: 07/05/2016 CLINICAL DATA:  Right lower quadrant/pelvic pain for 1 week EXAM: OBSTETRIC <14 WK US AND TRANSVAGINAL OB US TECHNIQUE: Both transabdominal and transvaginal ultrasound examinations were performed for complete evaluation of the gestation as well as the maternal uterus, adnexal regions, and pelvic cul-de-sac. Transvaginal technique was performed to assess early pregnancy. COMPARISON:  None. FINDINGS: Intrauterine gestational sac: Visualized Yolk sac:  Visualized Embryo:  Not visualized Cardiac Activity: Not visualized MSD: 6  mm   5 w   2  d Subchorionic hemorrhage:  None visualized. Maternal uterus/adnexae: Cervical os is closed. Right ovary appears normal. There is a 3.3 x 2.4 x 3.4 cm probable corpus luteum arising from the left ovary. No other extrauterine pelvic mass. A small amount of free fluid is noted in the cul-de-sac. IMPRESSION: There is a gestational sac containing a yolk sac, but no fetal pole currently visualized. Based on gestational sac size, estimated gestational age is 5+ weeks. Advise follow-up ultrasound in approximately 14 days to assess for fetal pole and to assess for fetal viability. Uterus otherwise appears unremarkable. No subchorionic hemorrhage seen. Probable prominent corpus luteum arising from left ovary. Small amount of free pelvic fluid is of uncertain etiology. This finding potentially could indicate recent ovarian cyst leakage. Electronically Signed   By: Bretta BangWilliam  Woodruff III M.D.   On: 07/05/2016 15:17   [place] Ob Transvaginal  Result Date: 07/10/2016 CLINICAL DATA:  Acute onset of generalized abdominal pain. Initial encounter. EXAM: TRANSVAGINAL OB ULTRASOUND TECHNIQUE: Transvaginal ultrasound was  performed for complete evaluation of the gestation as well as the maternal uterus, adnexal regions, and pelvic cul-de-sac. COMPARISON:  Pelvic ultrasound performed 07/05/2016 FINDINGS: Intrauterine gestational sac: Single; visualized and normal in shape. Yolk sac:  Yes Embryo:  Yes Cardiac Activity: Yes Heart Rate: Too small to characterize at this time CRL:   1.6  mm   Still too small to calculate a gestational age. Subchorionic hemorrhage:  None visualized. Maternal uterus/adnexae: The uterus is otherwise unremarkable. The ovaries are within normal limits. The right ovary measures 3.3 x 2.3 x 1.6 cm, while the left ovary measures 4.8 x 2.8 x 3.1 cm. No suspicious adnexal masses are seen; there  is no evidence for ovarian torsion. A small amount of free fluid within the pelvic cul-de-sac is likely physiologic in nature. IMPRESSION: Single live intrauterine pregnancy noted, with a crown-rump length of 2 mm. It remains too small to calculate a gestational age at this time. If the quantitative beta HCG level continues to rise, follow-up pelvic ultrasound could be considered in 2 weeks to determine a gestational age. Electronically Signed   By: Roanna Raider M.D.   On: 07/10/2016 23:33   US Ob Transvaginal  Result Date: 07/05/2016 CLINICAL DATA:  Right lower quadrant/pelvic pain for 1 week EXAM: OBSTETRIC <14 WK [place] AND TRANSVAGINAL OB US TECHNIQUE: Both transabdominal and transvaginal ultrasound examinations were performed for complete evaluation of the gestation as well as the maternal uterus, adnexal regions, and pelvic cul-de-sac. Transvaginal technique was performed to assess early pregnancy. COMPARISON:  None. FINDINGS: Intrauterine gestational sac: Visualized Yolk sac:  Visualized Embryo:  Not visualized Cardiac Activity: Not visualized MSD: 6  mm   5 w   2  d Subchorionic hemorrhage:  None visualized. Maternal uterus/adnexae: Cervical os is closed. Right ovary appears normal. There is a 3.3 x 2.4 x 3.4 cm  probable corpus luteum arising from the left ovary. No other extrauterine pelvic mass. A small amount of free fluid is noted in the cul-de-sac. IMPRESSION: There is a gestational sac containing a yolk sac, but no fetal pole currently visualized. Based on gestational sac size, estimated gestational age is 5+ weeks. Advise follow-up ultrasound in approximately 14 days to assess for fetal pole and to assess for fetal viability. Uterus otherwise appears unremarkable. No subchorionic hemorrhage seen. Probable prominent corpus luteum arising from left ovary. Small amount of free pelvic fluid is of uncertain etiology. This finding potentially could indicate recent ovarian cyst leakage. Electronically Signed   By: Bretta Bang III M.D.   On: 07/05/2016 15:17    MAU COURSE Orders Placed This Encounter  Procedures  . US OB Transvaginal  . Urinalysis, Routine w reflex microscopic (not at [facility])  . CBC    MDM - abd pain in pregnancy likely from corpus luteal cyst. Appropriate progression since previous US. No evidence of emergent condition. No fever or leukocytosis.   ASSESSMENT 1. Live IUP   2. Abdominal pain affecting pregnancy, antepartum     PLAN Discharge home in stable condition. Fist trimester precautions.  Follow-up Information    [facility] your choice .   Why:  [facility]       [facility] [place] MATERN[facility] ADMISS[facility] .   Why:  as needed in emergencies Contact information: [address] [id number] mc [place] [address] [phone]           Medication List    TAKE these medications   A+D FIRST AID Oint Apply 1 application topically daily. Apply to tattoo daily   acetaminophen 500 MG tablet Commonly known as:  TYLENOL Take 500 mg by mouth every 6 (six) hours as needed.   CONCEPT OB 130-92.4-1 MG Caps Take 1 tablet by mouth daily.   ferrous sulfate 325 (65 FE) MG tablet Take 1 tablet (325 mg  total) by mouth every other day.        Dorathy Kinsman, CNM 07/11/2016  1:10 AM

## 2016-07-11 NOTE — Discharge Instructions (Signed)
First Trimester of Pregnancy The first trimester of pregnancy is from week 1 until the end of week 12 (months 1 through 3). A week after a sperm fertilizes an egg, the egg will implant on the wall of the uterus. This embryo will begin to develop into a baby. Genes from you and your partner are forming the baby. The female genes determine whether the baby is a boy or a girl. At 6-8 weeks, the eyes and face are formed, and the heartbeat can be seen on ultrasound. At the end of 12 weeks, all the baby's organs are formed.  Now that you are pregnant, you will want to do everything you can to have a healthy baby. Two of the most important things are to get good prenatal care and to follow your health care provider's instructions. Prenatal care is all the medical care you receive before the baby's birth. This care will help prevent, find, and treat any problems during the pregnancy and childbirth. BODY CHANGES Your body goes through many changes during pregnancy. The changes vary from woman to woman.   You may gain or lose a couple of pounds at first.  You may feel sick to your stomach (nauseous) and throw up (vomit). If the vomiting is uncontrollable, call your health care provider.  You may tire easily.  You may develop headaches that can be relieved by medicines approved by your health care provider.  You may urinate more often. Painful urination may mean you have a bladder infection.  You may develop heartburn as a result of your pregnancy.  You may develop constipation because certain hormones are causing the muscles that push waste through your intestines to slow down.  You may develop hemorrhoids or swollen, bulging veins (varicose veins).  Your breasts may begin to grow larger and become tender. Your nipples may stick out more, and the tissue that surrounds them (areola) may become darker.  Your gums may bleed and may be sensitive to brushing and flossing.  Dark spots or blotches (chloasma,  mask of pregnancy) may develop on your face. This will likely fade after the baby is born.  Your menstrual periods will stop.  You may have a loss of appetite.  You may develop cravings for certain kinds of food.  You may have changes in your emotions from day to day, such as being excited to be pregnant or being concerned that something may go wrong with the pregnancy and baby.  You may have more vivid and strange dreams.  You may have changes in your hair. These can include thickening of your hair, rapid growth, and changes in texture. Some women also have hair loss during or after pregnancy, or hair that feels dry or thin. Your hair will most likely return to normal after your baby is born. WHAT TO EXPECT AT YOUR PRENATAL VISITS During a routine prenatal visit:  You will be weighed to make sure you and the baby are growing normally.  Your blood pressure will be taken.  Your abdomen will be measured to track your baby's growth.  The fetal heartbeat will be listened to starting around week 10 or 12 of your pregnancy.  Test results from any previous visits will be discussed. Your health care provider may ask you:  How you are feeling.  If you are feeling the baby move.  If you have had any abnormal symptoms, such as leaking fluid, bleeding, severe headaches, or abdominal cramping.  If you are using any tobacco products,   including cigarettes, chewing tobacco, and electronic cigarettes.  If you have any questions. Other tests that may be performed during your first trimester include:  Blood tests to find your blood type and to check for the presence of any previous infections. They will also be used to check for low iron levels (anemia) and Rh antibodies. Later in the pregnancy, blood tests for diabetes will be done along with other tests if problems develop.  Urine tests to check for infections, diabetes, or protein in the urine.  An ultrasound to confirm the proper growth  and development of the baby.  An amniocentesis to check for possible genetic problems.  Fetal screens for spina bifida and Down syndrome.  You may need other tests to make sure you and the baby are doing well.  HIV (human immunodeficiency virus) testing. Routine prenatal testing includes screening for HIV, unless you choose not to have this test. HOME CARE INSTRUCTIONS  Medicines  Follow your health care provider's instructions regarding medicine use. Specific medicines may be either safe or unsafe to take during pregnancy.  Take your prenatal vitamins as directed.  If you develop constipation, try taking a stool softener if your health care provider approves. Diet  Eat regular, well-balanced meals. Choose a variety of foods, such as meat or vegetable-based protein, fish, milk and low-fat dairy products, vegetables, fruits, and whole grain breads and cereals. Your health care provider will help you determine the amount of weight gain that is right for you.  Avoid raw meat and uncooked cheese. These carry germs that can cause birth defects in the baby.  Eating four or five small meals rather than three large meals a day may help relieve nausea and vomiting. If you start to feel nauseous, eating a few soda crackers can be helpful. Drinking liquids between meals instead of during meals also seems to help nausea and vomiting.  If you develop constipation, eat more high-fiber foods, such as fresh vegetables or fruit and whole grains. Drink enough fluids to keep your urine clear or pale yellow. Activity and Exercise  Exercise only as directed by your health care provider. Exercising will help you:  Control your weight.  Stay in shape.  Be prepared for labor and delivery.  Experiencing pain or cramping in the lower abdomen or low back is a good sign that you should stop exercising. Check with your health care provider before continuing normal exercises.  Try to avoid standing for long  periods of time. Move your legs often if you must stand in one place for a long time.  Avoid heavy lifting.  Wear low-heeled shoes, and practice good posture.  You may continue to have sex unless your health care provider directs you otherwise. Relief of Pain or Discomfort  Wear a good support bra for breast tenderness.   Take warm sitz baths to soothe any pain or discomfort caused by hemorrhoids. Use hemorrhoid cream if your health care provider approves.   Rest with your legs elevated if you have leg cramps or low back pain.  If you develop varicose veins in your legs, wear support hose. Elevate your feet for 15 minutes, 3-4 times a day. Limit salt in your diet. Prenatal Care  Schedule your prenatal visits by the twelfth week of pregnancy. They are usually scheduled monthly at first, then more often in the last 2 months before delivery.  Write down your questions. Take them to your prenatal visits.  Keep all your prenatal visits as directed by your   health care provider. Safety  Wear your seat belt at all times when driving.  Make a list of emergency phone numbers, including numbers for family, friends, the hospital, and police and fire departments. General Tips  Ask your health care provider for a referral to a local prenatal education class. Begin classes no later than at the beginning of month 6 of your pregnancy.  Ask for help if you have counseling or nutritional needs during pregnancy. Your health care provider can offer advice or refer you to specialists for help with various needs.  Do not use hot tubs, steam rooms, or saunas.  Do not douche or use tampons or scented sanitary pads.  Do not cross your legs for long periods of time.  Avoid cat litter boxes and soil used by cats. These carry germs that can cause birth defects in the baby and possibly loss of the fetus by miscarriage or stillbirth.  Avoid all smoking, herbs, alcohol, and medicines not prescribed by  your health care provider. Chemicals in these affect the formation and growth of the baby.  Do not use any tobacco products, including cigarettes, chewing tobacco, and electronic cigarettes. If you need help quitting, ask your health care provider. You may receive counseling support and other resources to help you quit.  Schedule a dentist appointment. At home, brush your teeth with a soft toothbrush and be gentle when you floss. SEEK MEDICAL CARE IF:   You have dizziness.  You have mild pelvic cramps, pelvic pressure, or nagging pain in the abdominal area.  You have persistent nausea, vomiting, or diarrhea.  You have a bad smelling vaginal discharge.  You have pain with urination.  You notice increased swelling in your face, hands, legs, or ankles. SEEK IMMEDIATE MEDICAL CARE IF:   You have a fever.  You are leaking fluid from your vagina.  You have spotting or bleeding from your vagina.  You have severe abdominal cramping or pain.  You have rapid weight gain or loss.  You vomit blood or material that looks like coffee grounds.  You are exposed to German measles and have never had them.  You are exposed to fifth disease or chickenpox.  You develop a severe headache.  You have shortness of breath.  You have any kind of trauma, such as from a fall or a car accident.   This information is not intended to replace advice given to you by your health care provider. Make sure you discuss any questions you have with your health care provider.   Document Released: 09/27/2001 Document Revised: 10/24/2014 Document Reviewed: 08/13/2013 Elsevier Interactive Patient Education 2016 [employer]vier Inc.  

## 2016-07-19 ENCOUNTER — Ambulatory Visit: Payer: Medicaid Other | Admitting: *Deleted

## 2016-07-19 ENCOUNTER — Ambulatory Visit (HOSPITAL_COMMUNITY)
Admission: RE | Admit: 2016-07-19 | Discharge: 2016-07-19 | Disposition: A | Discharge status: Home / Self Care | Payer: Medicaid Other | Source: Ambulatory Visit | Attending: Student | Admitting: Student

## 2016-07-19 DIAGNOSIS — O26891 Other specified pregnancy related conditions, first trimester: Secondary | ICD-10-CM | POA: Diagnosis present

## 2016-07-19 DIAGNOSIS — R109 Unspecified abdominal pain: Secondary | ICD-10-CM | POA: Insufficient documentation

## 2016-07-19 DIAGNOSIS — Z3491 Encounter for supervision of normal pregnancy, unspecified, first trimester: Secondary | ICD-10-CM

## 2016-07-19 DIAGNOSIS — Z3490 Encounter for supervision of normal pregnancy, unspecified, unspecified trimester: Secondary | ICD-10-CM

## 2016-07-19 DIAGNOSIS — Z3A01 Less than 8 weeks gestation of pregnancy: Secondary | ICD-10-CM | POA: Insufficient documentation

## 2016-07-19 DIAGNOSIS — O26899 Other specified pregnancy related conditions, unspecified trimester: Secondary | ICD-10-CM

## 2016-07-19 NOTE — Progress Notes (Signed)
Natasha Good came in for ultrasound results.   I reviewed results today with her showing normal ultrasound indicating early pregnancy. She has already set up prenatal appointment with a private provider.  I reviewed her meds with her and instructed her to check with provider before she takes anything new. She voices understanding.

## 2016-08-11 ENCOUNTER — Other Ambulatory Visit (HOSPITAL_COMMUNITY): Payer: Self-pay | Admitting: Nurse Practitioner

## 2016-08-11 DIAGNOSIS — Z3A13 13 weeks gestation of pregnancy: Secondary | ICD-10-CM

## 2016-08-11 DIAGNOSIS — Z3682 Encounter for antenatal screening for nuchal translucency: Secondary | ICD-10-CM

## 2016-08-11 LAB — OB RESULTS CONSOLE RUBELLA ANTIBODY, IGM: RUBELLA: IMMUNE

## 2016-08-11 LAB — OB RESULTS CONSOLE RPR: RPR: NONREACTIVE

## 2016-08-11 LAB — OB RESULTS CONSOLE HEPATITIS B SURFACE ANTIGEN: Hepatitis B Surface Ag: NEGATIVE

## 2016-08-11 LAB — OB RESULTS CONSOLE VARICELLA ZOSTER ANTIBODY, IGG: VARICELLA IGG: IMMUNE

## 2016-08-17 ENCOUNTER — Encounter (HOSPITAL_COMMUNITY): Payer: Self-pay | Admitting: Nurse Practitioner

## 2016-08-22 ENCOUNTER — Inpatient Hospital Stay (HOSPITAL_COMMUNITY)
Admission: AD | Admit: 2016-08-22 | Discharge: 2016-08-22 | Disposition: A | Discharge status: Home / Self Care | Payer: Medicaid Other | Source: Ambulatory Visit | Attending: Family Medicine | Admitting: Family Medicine

## 2016-08-22 ENCOUNTER — Encounter (HOSPITAL_COMMUNITY): Payer: Self-pay

## 2016-08-22 DIAGNOSIS — O210 Mild hyperemesis gravidarum: Secondary | ICD-10-CM | POA: Diagnosis not present

## 2016-08-22 DIAGNOSIS — O218 Other vomiting complicating pregnancy: Secondary | ICD-10-CM | POA: Diagnosis not present

## 2016-08-22 DIAGNOSIS — R112 Nausea with vomiting, unspecified: Secondary | ICD-10-CM | POA: Diagnosis present

## 2016-08-22 DIAGNOSIS — Z3A11 11 weeks gestation of pregnancy: Secondary | ICD-10-CM | POA: Diagnosis not present

## 2016-08-22 LAB — CBC WITH DIFFERENTIAL/PLATELET
BASOS PCT: 0 %
Basophils Absolute: 0 10*3/uL (ref 0.0–0.1)
EOS ABS: 0.1 10*3/uL (ref 0.0–0.7)
EOS PCT: 1 %
HCT: 36 % (ref 36.0–46.0)
Hemoglobin: 12.3 g/dL (ref 12.0–15.0)
Lymphocytes Relative: 24 %
Lymphs Abs: 1.7 10*3/uL (ref 0.7–4.0)
MCH: 30 pg (ref 26.0–34.0)
MCHC: 34.2 g/dL (ref 30.0–36.0)
MCV: 87.8 fL (ref 78.0–100.0)
MONO ABS: 0.3 10*3/uL (ref 0.1–1.0)
MONOS PCT: 4 %
Neutro Abs: 5 10*3/uL (ref 1.7–7.7)
Neutrophils Relative %: 71 %
Platelets: 242 10*3/uL (ref 150–400)
RBC: 4.1 MIL/uL (ref 3.87–5.11)
RDW: 13.1 % (ref 11.5–15.5)
WBC: 7.1 10*3/uL (ref 4.0–10.5)

## 2016-08-22 LAB — URINALYSIS, ROUTINE W REFLEX MICROSCOPIC
BILIRUBIN URINE: NEGATIVE
GLUCOSE, UA: NEGATIVE mg/dL
HGB URINE DIPSTICK: NEGATIVE
Leukocytes, UA: NEGATIVE
NITRITE: NEGATIVE
PH: 6.5 (ref 5.0–8.0)
Protein, ur: NEGATIVE mg/dL
Specific Gravity, Urine: 1.02 (ref 1.005–1.030)

## 2016-08-22 MED ORDER — M.V.I. ADULT IV INJ
Freq: Once | INTRAVENOUS | Status: DC
  Filled 2016-08-22: qty 1000

## 2016-08-22 MED ORDER — PROMETHAZINE HCL 12.5 MG PO TABS: 12.5000 mg | ORAL_TABLET | Freq: Four times a day (QID) | ORAL | 0 refills | Status: DC | PRN

## 2016-08-22 MED ORDER — LACTATED RINGERS IV BOLUS (SEPSIS): 1000.0000 mL | Freq: Once | INTRAVENOUS | Status: DC

## 2016-08-22 NOTE — MAU Provider Note (Signed)
History     CSN: [id number]  Arrival date and time: 08/22/16 1505   First Provider Initiated Contact with Patient 08/22/16 1933      Chief Complaint  Patient presents with  . Morning Sickness   HPI Natasha Good is 20 y.o. G1P0 2569w4d weeks presenting with nausea and vomiting in first trimester pregnancy.  She has not begun care.  States she wakes up with vomiting and usually vomits 1 X day. But today she has not been able to keep fluid or food down.  [facility] patient of the [facility]      Past Medical History:  Diagnosis Date  . Acid reflux   . Anemia   . Anxiety   . Ovarian cyst     Past Surgical History:  Procedure Laterality Date  . WISDOM TOOTH EXTRACTION      Family History  Problem Relation Age of Onset  . Heart disease Father     Social History  Substance Use Topics  . Smoking status: Never Smoker  . Smokeless tobacco: Never Used  . Alcohol use No    Allergies: No Known Allergies  Prescriptions Prior to Admission  Medication Sig Dispense Refill Last Dose  . acetaminophen (TYLENOL) 500 MG tablet Take 500 mg by mouth every 6 (six) hours as needed.   Taking  . Prenat w/o A Vit-FeFum-FePo-FA (CONCEPT OB) 130-92.4-1 MG CAPS Take 1 tablet by mouth daily. 30 capsule 12 Taking    Review of Systems  Constitutional: Positive for fever. Negative for chills.  Gastrointestinal: Positive for nausea and vomiting. Negative for abdominal pain.  Genitourinary: Negative for dysuria, frequency, hematuria and urgency.       Neg for vaginal discharge or bleeding.  Neurological: Negative for dizziness and headaches.   Physical Exam   Blood pressure 104/67, pulse 73, temperature 98.4 F (36.9 C), temperature source Oral, resp. rate 16, height 5\' 4"  (1.626 m), weight 115 lb 9.6 oz (52.4 kg), last menstrual period 05/24/2016.  Physical Exam  Nursing note and vitals reviewed. Constitutional: She is oriented to person, place, and time. She appears well-developed and  well-nourished. No distress.  HENT:  Head: Normocephalic.  Neck: Normal range of motion.  Cardiovascular: Normal rate.   Respiratory: Effort normal.  GI: She exhibits no distension. There is no tenderness. There is no rebound and no guarding.  Genitourinary:  Genitourinary Comments: Deferred; neg for vaginal sxs  Neurological: She is alert and oriented to person, place, and time.  Skin: Skin is warm and dry.  Psychiatric: She has a normal mood and affect. Her behavior is normal. Thought content normal.   Results for orders placed or performed during the hospital encounter of 08/22/16 (from the past 24 hour(s))  Urinalysis, Routine w reflex microscopic (not at [facility])     Status: Abnormal   Collection Time: 08/22/16  3:55 PM  Result Value Ref Range   Color, Urine YELLOW YELLOW   APPearance CLEAR CLEAR   Specific Gravity, Urine 1.020 1.005 - 1.030   pH 6.5 5.0 - 8.0   Glucose, UA NEGATIVE NEGATIVE mg/dL   Hgb urine dipstick NEGATIVE NEGATIVE   Bilirubin Urine NEGATIVE NEGATIVE   Ketones, ur >80 (A) NEGATIVE mg/dL   Protein, ur NEGATIVE NEGATIVE mg/dL   Nitrite NEGATIVE NEGATIVE   Leukocytes, UA NEGATIVE NEGATIVE  CBC with Differential/Platelet     Status: None   Collection Time: 08/22/16  8:08 PM  Result Value Ref Range   WBC 7.1 4.0 - 10.5 K/uL  RBC 4.10 3.87 - 5.11 MIL/uL   Hemoglobin 12.3 12.0 - 15.0 g/dL   HCT 95.636.0 21.336.0 - 08.646.0 %   MCV 87.8 78.0 - 100.0 fL   MCH 30.0 26.0 - 34.0 pg   MCHC 34.2 30.0 - 36.0 g/dL   RDW 57.813.1 46.911.5 - 62.915.5 %   Platelets 242 150 - 400 K/uL   Neutrophils Relative % 71 %   Neutro Abs 5.0 1.7 - 7.7 K/uL   Lymphocytes Relative 24 %   Lymphs Abs 1.7 0.7 - 4.0 K/uL   Monocytes Relative 4 %   Monocytes Absolute 0.3 0.1 - 1.0 K/uL   Eosinophils Relative 1 %   Eosinophils Absolute 0.1 0.0 - 0.7 K/uL   Basophils Relative 0 %   Basophils Absolute 0.0 0.0 - 0.1 K/uL    MAU Course  Procedures  MDM MSE Labs Exam Rx for home us for N&V in  pregancy Assessment and Plan  A: Nausea and vomiting in pregnancy at 4239w4d gestation  P: Rx for Phenergan for home use     Keeps scheduled appt for continued prenatal care.   KEY,EVE M 08/22/2016, 8:50 PM

## 2016-08-22 NOTE — MAU Note (Signed)
Nausea and vomiting, can't keep anything down since 0700.  No meds for nausea

## 2016-08-22 NOTE — MAU Note (Signed)
Has been sipping h2o while waiting in lobby, no vomiting

## 2016-08-22 NOTE — Discharge Instructions (Signed)

## 2016-08-31 ENCOUNTER — Encounter (HOSPITAL_COMMUNITY): Payer: Self-pay

## 2016-08-31 ENCOUNTER — Ambulatory Visit (HOSPITAL_COMMUNITY)
Admission: RE | Admit: 2016-08-31 | Discharge: 2016-08-31 | Disposition: A | Discharge status: Home / Self Care | Payer: Medicaid Other | Source: Ambulatory Visit | Attending: Nurse Practitioner | Admitting: Nurse Practitioner

## 2016-08-31 ENCOUNTER — Ambulatory Visit (HOSPITAL_COMMUNITY): Payer: Medicaid Other

## 2016-10-17 NOTE — L&D Delivery Note (Signed)
Arrived to patient's room, patient had been pushing for about 1 hour, is at +3 station and beginning to crown. Good maternal effort with contractions every 2 minutes.    20 y.o. G1P0 at 3134w4d delivered a viable female infant in cephalic, LOA position. No nuchal cord. Right anterior shoulder delivered with ease. 60 sec delayed cord clamping. Cord clamped x2 and cut. Placenta delivered spontaneously intact, with 3VC. Fundus firm on exam with massage and pitocin. Good hemostasis noted.  Anesthesia: Epidural Laceration: NONE Suture: N/A Good hemostasis noted. EBL: 150cc  Mom and baby recovering in LDR.    Apgars: APGAR (1 MIN):  7 APGAR (5 MINS):  9  Weight: Pending skin to skin  Sponge and instrument count were correct x2. Placenta sent to L&D.  Jen MowElizabeth Denzil Mceachron, DO OB Fellow [facility], [facility] 02/27/2017, 6:00 AM

## 2016-11-08 ENCOUNTER — Encounter (HOSPITAL_COMMUNITY): Payer: Self-pay | Admitting: *Deleted

## 2016-11-08 ENCOUNTER — Inpatient Hospital Stay (HOSPITAL_COMMUNITY)
Admission: AD | Admit: 2016-11-08 | Discharge: 2016-11-08 | Disposition: A | Discharge status: Home / Self Care | Payer: Medicaid Other | Source: Ambulatory Visit | Attending: Family Medicine | Admitting: Family Medicine

## 2016-11-08 DIAGNOSIS — O26892 Other specified pregnancy related conditions, second trimester: Secondary | ICD-10-CM | POA: Insufficient documentation

## 2016-11-08 DIAGNOSIS — N930 Postcoital and contact bleeding: Secondary | ICD-10-CM

## 2016-11-08 DIAGNOSIS — Z3A22 22 weeks gestation of pregnancy: Secondary | ICD-10-CM | POA: Insufficient documentation

## 2016-11-08 DIAGNOSIS — Z6791 Unspecified blood type, Rh negative: Secondary | ICD-10-CM | POA: Diagnosis not present

## 2016-11-08 DIAGNOSIS — O4692 Antepartum hemorrhage, unspecified, second trimester: Secondary | ICD-10-CM | POA: Diagnosis present

## 2016-11-08 LAB — URINALYSIS, ROUTINE W REFLEX MICROSCOPIC
Bilirubin Urine: NEGATIVE
Glucose, UA: 150 mg/dL — AB
Hgb urine dipstick: NEGATIVE
KETONES UR: 5 mg/dL — AB
NITRITE: NEGATIVE
PROTEIN: NEGATIVE mg/dL
SPECIFIC GRAVITY, URINE: 1.02 (ref 1.005–1.030)
pH: 7 (ref 5.0–8.0)

## 2016-11-08 MED ORDER — RHO D IMMUNE GLOBULIN 1500 UNIT/2ML IJ SOSY
300.0000 ug | PREFILLED_SYRINGE | Freq: Once | INTRAMUSCULAR | Status: AC
  Administered 2016-11-08: 300 ug via INTRAMUSCULAR
  Filled 2016-11-08: qty 2

## 2016-11-08 NOTE — MAU Note (Signed)
Vag spotting spotted just started a few minutes ago.  Occurred following intercourse..  Some pain in lower abd,  No prior episodes of bleeding

## 2016-11-08 NOTE — Discharge Instructions (Signed)
Vaginal Bleeding During Pregnancy, Second Trimester °A small amount of bleeding (spotting) from the vagina is relatively common in pregnancy. It usually stops on its own. Various things can cause bleeding or spotting in pregnancy. Some bleeding may be related to the pregnancy, and some may not. Sometimes the bleeding is normal and is not a problem. However, bleeding can also be a sign of something serious. Be sure to tell your health care provider about any vaginal bleeding right away. °Some possible causes of vaginal bleeding during the second trimester include: °· Infection, inflammation, or growths on the cervix. °· The placenta may be partially or completely covering the opening of the cervix inside the uterus (placenta previa). °· The placenta may have separated from the uterus (abruption of the placenta). °· You may be having early (preterm) labor. °· The cervix may not be strong enough to keep a baby inside the uterus (cervical insufficiency). °· Tiny cysts may have developed in the uterus instead of pregnancy tissue (molar pregnancy). °Follow these instructions at home: °Watch your condition for any changes. The following actions may help to lessen any discomfort you are feeling: °· Follow your health care provider's instructions for limiting your activity. If your health care provider orders bed rest, you may need to stay in bed and only get up to use the bathroom. However, your health care provider may allow you to continue light activity. °· If needed, make plans for someone to help with your regular activities and responsibilities while you are on bed rest. °· Keep track of the number of pads you use each day, how often you change pads, and how soaked (saturated) they are. Write this down. °· Do not use tampons. Do not douche. °· Do not have sexual intercourse or orgasms until approved by your health care provider. °· If you pass any tissue from your vagina, save the tissue so you can show it to your  health care provider. °· Only take over-the-counter or prescription medicines as directed by your health care provider. °· Do not take aspirin because it can make you bleed. °· Do not exercise or perform any strenuous activities or heavy lifting without your health care provider's permission. °· Keep all follow-up appointments as directed by your health care provider. °Contact a health care provider if: °· You have any vaginal bleeding during any part of your pregnancy. °· You have cramps or labor pains. °· You have a fever, not controlled by medicine. °Get help right away if: °· You have severe cramps in your back or belly (abdomen). °· You have contractions. °· You have chills. °· You pass large clots or tissue from your vagina. °· Your bleeding increases. °· You feel light-headed or weak, or you have fainting episodes. °· You are leaking fluid or have a gush of fluid from your vagina. °This information is not intended to replace advice given to you by your health care provider. Make sure you discuss any questions you have with your health care provider. °Document Released: 07/13/2005 Document Revised: 03/10/2016 Document Reviewed: 06/10/2013 °Elsevier Interactive Patient Education © 2017 Elsevier Inc. ° °

## 2016-11-08 NOTE — MAU Provider Note (Signed)
History     CSN: [id number]  Arrival date and time: 11/08/16 1202   First Provider Initiated Contact with Patient 11/08/16 1254      Chief Complaint  Patient presents with  . Vaginal Bleeding   HPI Natasha Good is a 21 y.o. G1P0 at 7770w5d who presents with vaginal bleeding. Reports 1 episode of pink spotting on toilet paper 1 hour ago. Bleeding has not continued. Denies abdominal pain, n/v/d, constipation, vaginal discharge. Positive fetal movement. Had intercourse just before bleeding episode.   OB History    Gravida Para Term Preterm AB Living   1         0   SAB TAB Ectopic Multiple Live Births                  Past Medical History:  Diagnosis Date  . Acid reflux   . Anemia   . Anxiety   . Ovarian cyst     Past Surgical History:  Procedure Laterality Date  . WISDOM TOOTH EXTRACTION      Family History  Problem Relation Age of Onset  . Heart disease Father     Social History  Substance Use Topics  . Smoking status: Never Smoker  . Smokeless tobacco: Never Used  . Alcohol use No    Allergies: No Known Allergies  Prescriptions Prior to Admission  Medication Sig Dispense Refill Last Dose  . acetaminophen (TYLENOL) 500 MG tablet Take 500 mg by mouth every 6 (six) hours as needed.   Taking  . Prenat w/o A Vit-FeFum-FePo-FA (CONCEPT OB) 130-92.4-1 MG CAPS Take 1 tablet by mouth daily. 30 capsule 12 Taking  . Prenatal Vit-Fe Phos-FA-Omega (VITAFOL GUMMIES) 3.33-0.333-34.8 MG CHEW CHEW AND SWALLOW 3 GUMMIES PO QD  11   . promethazine (PHENERGAN) 12.5 MG tablet Take 1 tablet (12.5 mg total) by mouth every 6 (six) hours as needed for nausea or vomiting. 30 tablet 0     Review of Systems  Constitutional: Negative.   Gastrointestinal: Negative.   Genitourinary: Positive for vaginal bleeding. Negative for dysuria and vaginal discharge.   Physical Exam   Blood pressure 104/68, pulse 91, temperature 98.8 F (37.1 C), temperature source Oral, resp. rate 16, weight  117 lb 12 oz (53.4 kg), last menstrual period 05/24/2016, SpO2 100 %.  Physical Exam  Nursing note and vitals reviewed. Constitutional: She is oriented to person, place, and time. She appears well-developed and well-nourished. No distress.  HENT:  Head: Normocephalic and atraumatic.  Eyes: Conjunctivae are normal. Right eye exhibits no discharge. Left eye exhibits no discharge. No scleral icterus.  Neck: Normal range of motion.  Cardiovascular: Normal rate, regular rhythm and normal heart sounds.   No murmur heard. Respiratory: Effort normal and breath sounds normal. No respiratory distress. She has no wheezes.  GI: Soft. There is no tenderness.  Genitourinary: Vagina normal. No bleeding in the vagina.  Genitourinary Comments: Cervix visually closed  Neurological: She is alert and oriented to person, place, and time.  Skin: Skin is warm and dry. She is not diaphoretic.  Psychiatric: She has a normal mood and affect. Her behavior is normal. Judgment and thought content normal.    MAU Course  Procedures Results for orders placed or performed during the hospital encounter of 11/08/16 (from the past 24 hour(s))  Urinalysis, Routine w reflex microscopic     Status: Abnormal   Collection Time: 11/08/16 12:16 PM  Result Value Ref Range   Color, Urine YELLOW YELLOW   APPearance  CLEAR CLEAR   Specific Gravity, Urine 1.020 1.005 - 1.030   pH 7.0 5.0 - 8.0   Glucose, UA 150 (A) NEGATIVE mg/dL   Hgb urine dipstick NEGATIVE NEGATIVE   Bilirubin Urine NEGATIVE NEGATIVE   Ketones, ur 5 (A) NEGATIVE mg/dL   Protein, ur NEGATIVE NEGATIVE mg/dL   Nitrite NEGATIVE NEGATIVE   Leukocytes, UA TRACE (A) NEGATIVE   RBC / HPF 0-5 0 - 5 RBC/hpf   WBC, UA 0-5 0 - 5 WBC/hpf   Bacteria, UA RARE (A) NONE SEEN   Squamous Epithelial / LPF 0-5 (A) NONE SEEN   Mucous PRESENT   Rh IG workup (includes ABO/Rh)     Status: None (Preliminary result)   Collection Time: 11/08/16  1:34 PM  Result Value Ref Range    Gestational Age(Wks) 22    ABO/RH(D) A NEG    Antibody Screen NEG    Fetal Screen NEG    Unit Number [id number]    Blood Component Type RHIG    Unit division 00    Status of Unit ISSUED    Transfusion Status OK TO TRANSFUSE     MDM A negative Rhogham given FHT 156 by doppler Assessment and Plan  A: 1. Postcoital bleeding   2. Rh negative state in antepartum period, second trimester    P: Discharge home Pelvic rest Discussed reasons to return to MAU Keep f/u with OB  Judeth Horn 11/08/2016, 12:47 PM

## 2016-11-09 LAB — RH IG WORKUP (INCLUDES ABO/RH)
ABO/RH(D): A NEG
ANTIBODY SCREEN: NEGATIVE
Fetal Screen: NEGATIVE
Gestational Age(Wks): 22
Unit division: 0

## 2016-11-13 ENCOUNTER — Encounter (HOSPITAL_COMMUNITY): Payer: Self-pay | Admitting: *Deleted

## 2016-11-13 ENCOUNTER — Inpatient Hospital Stay (HOSPITAL_COMMUNITY)
Admission: AD | Admit: 2016-11-13 | Discharge: 2016-11-14 | Disposition: A | Discharge status: Home / Self Care | Payer: Medicaid Other | Source: Ambulatory Visit | Attending: Obstetrics & Gynecology | Admitting: Obstetrics & Gynecology

## 2016-11-13 DIAGNOSIS — Z3A23 23 weeks gestation of pregnancy: Secondary | ICD-10-CM | POA: Insufficient documentation

## 2016-11-13 DIAGNOSIS — O98512 Other viral diseases complicating pregnancy, second trimester: Secondary | ICD-10-CM | POA: Insufficient documentation

## 2016-11-13 DIAGNOSIS — R05 Cough: Secondary | ICD-10-CM | POA: Diagnosis present

## 2016-11-13 DIAGNOSIS — K219 Gastro-esophageal reflux disease without esophagitis: Secondary | ICD-10-CM | POA: Diagnosis not present

## 2016-11-13 DIAGNOSIS — O99342 Other mental disorders complicating pregnancy, second trimester: Secondary | ICD-10-CM | POA: Insufficient documentation

## 2016-11-13 DIAGNOSIS — F419 Anxiety disorder, unspecified: Secondary | ICD-10-CM | POA: Diagnosis not present

## 2016-11-13 DIAGNOSIS — R0981 Nasal congestion: Secondary | ICD-10-CM | POA: Diagnosis present

## 2016-11-13 DIAGNOSIS — O99612 Diseases of the digestive system complicating pregnancy, second trimester: Secondary | ICD-10-CM | POA: Insufficient documentation

## 2016-11-13 DIAGNOSIS — O99512 Diseases of the respiratory system complicating pregnancy, second trimester: Secondary | ICD-10-CM | POA: Diagnosis not present

## 2016-11-13 DIAGNOSIS — O479 False labor, unspecified: Secondary | ICD-10-CM

## 2016-11-13 DIAGNOSIS — J069 Acute upper respiratory infection, unspecified: Secondary | ICD-10-CM | POA: Diagnosis not present

## 2016-11-13 DIAGNOSIS — B349 Viral infection, unspecified: Secondary | ICD-10-CM | POA: Diagnosis not present

## 2016-11-13 LAB — URINALYSIS, ROUTINE W REFLEX MICROSCOPIC
BILIRUBIN URINE: NEGATIVE
GLUCOSE, UA: 50 mg/dL — AB
HGB URINE DIPSTICK: NEGATIVE
Ketones, ur: NEGATIVE mg/dL
Leukocytes, UA: NEGATIVE
NITRITE: NEGATIVE
PH: 7 (ref 5.0–8.0)
Protein, ur: NEGATIVE mg/dL
SPECIFIC GRAVITY, URINE: 1.01 (ref 1.005–1.030)

## 2016-11-13 MED ORDER — TERBUTALINE SULFATE 1 MG/ML IJ SOLN
INTRAMUSCULAR | Status: AC
Start: 2016-11-13 — End: 2016-11-13
  Administered 2016-11-13: 0.25 mg via SUBCUTANEOUS
  Filled 2016-11-13: qty 1

## 2016-11-13 MED ORDER — ACETAMINOPHEN 325 MG PO TABS
650.0000 mg | ORAL_TABLET | Freq: Once | ORAL | Status: AC
  Administered 2016-11-13: 650 mg via ORAL
  Filled 2016-11-13: qty 2

## 2016-11-13 MED ORDER — TERBUTALINE SULFATE 1 MG/ML IJ SOLN
0.2500 mg | Freq: Once | INTRAMUSCULAR | Status: AC
Start: 2016-11-13 — End: 2016-11-13
  Administered 2016-11-13: 0.25 mg via SUBCUTANEOUS

## 2016-11-13 MED ORDER — LACTATED RINGERS IV BOLUS (SEPSIS)
1000.0000 mL | Freq: Once | INTRAVENOUS | Status: AC
  Administered 2016-11-13: 1000 mL via INTRAVENOUS

## 2016-11-13 NOTE — MAU Provider Note (Signed)
Obstetric Resident MAU Note  Chief Complaint:  Nasal Congestion and Cough   None    HPI: Natasha Good is a 21 y.o. G1P0 at [redacted]w[redacted]d who presents to maternity admissions reporting nasal congestion and cough that started earlier today. She is also having hot and cold flashes, but no fevers here or at home that she is aware of. She reports that her boyfriend was sick last week with similar symptoms, but has since improved. She denies any nausea or vomiting. No body aches. No sore throat. No ear pain. She has not tried any medications at home. She is tolerating PO intake without difficulty.   Denies contractions, leakage of fluid or vaginal bleeding. Good fetal movement.   Patient was noted to have regular contractions on the monitor however. Denies any sexual intercourse over the past 24 hrs.   Pregnancy Course: Receives care at the [facility]. Rh negative- has received rhogam x 1 secondary to postcoital bleeding  There are no active problems to display for this patient.   Past Medical History:  Diagnosis Date  . Acid reflux   . Anemia   . Anxiety   . Ovarian cyst     OB History  Gravida Para Term Preterm AB Living  1         0  SAB TAB Ectopic Multiple Live Births               # Outcome Date GA Lbr Len/2nd Weight Sex Delivery Anes PTL Lv  1 Current               Past Surgical History:  Procedure Laterality Date  . WISDOM TOOTH EXTRACTION      Family History: Family History  Problem Relation Age of Onset  . Heart disease Father     Social History: Social History  Substance Use Topics  . Smoking status: Never Smoker  . Smokeless tobacco: Never Used  . Alcohol use No    Allergies: No Known Allergies  Prescriptions Prior to Admission  Medication Sig Dispense Refill Last Dose  . Prenatal Vit-Fe Phos-FA-Omega (VITAFOL GUMMIES) 3.33-0.333-34.8 MG CHEW CHEW AND SWALLOW 1 GUMMIES PO TID  11 11/13/2016 at Unknown time  . promethazine (PHENERGAN) 12.5 MG tablet Take 1 tablet  (12.5 mg total) by mouth every 6 (six) hours as needed for nausea or vomiting. (Patient not taking: Reported on 11/08/2016) 30 tablet 0 More than a month at Unknown time    ROS: Pertinent findings in history of present illness.  Physical Exam  Blood pressure 100/63, pulse 89, temperature 99 F (37.2 C), temperature source Oral, resp. rate 16, last menstrual period 05/24/2016. CONSTITUTIONAL: Well-developed, well-nourished female in no acute distress.  HENT:  Normocephalic, atraumatic, External right and left ear normal. Oropharynx is clear and moist EYES: Conjunctivae normal. No scleral icterus.  NECK: Normal range of motion, supple, no lymphadenopathy SKIN: Skin is warm and dry. No rash noted. Not diaphoretic. No erythema. No pallor. NEUROLGIC: Alert and oriented to person, place, and time. No focal defects PSYCHIATRIC: Normal mood and affect. Normal behavior. Normal judgment and thought content. CARDIOVASCULAR: Normal heart rate noted, regular rhythm RESPIRATORY: No increased work of breathing, stable on room air. LCTAB.  ABDOMEN: Soft, nontender, nondistended, gravid appropriate for gestational age MUSCULOSKELETAL: Normal range of motion. No edema and no tenderness. 2+ distal pulses.  FHT:  Baseline 155 , moderate variability, accelerations present, no decelerations Contractions: q 2-3 mins  Patient received terbutaline and FHT with baseline of 155, +accels, -decels,  mod variability and uterine irritability, but no regular contractions noted   Labs: Results for orders placed or performed during the hospital encounter of 11/13/16 (from the past 24 hour(s))  Urinalysis, Routine w reflex microscopic     Status: Abnormal   Collection Time: 11/13/16  9:35 PM  Result Value Ref Range   Color, Urine YELLOW YELLOW   APPearance HAZY (A) CLEAR   Specific Gravity, Urine 1.010 1.005 - 1.030   pH 7.0 5.0 - 8.0   Glucose, UA 50 (A) NEGATIVE mg/dL   Hgb urine dipstick NEGATIVE NEGATIVE    Bilirubin Urine NEGATIVE NEGATIVE   Ketones, ur NEGATIVE NEGATIVE mg/dL   Protein, ur NEGATIVE NEGATIVE mg/dL   Nitrite NEGATIVE NEGATIVE   Leukocytes, UA NEGATIVE NEGATIVE    Imaging:  No results found.  MAU Course: Patient presented with symptoms of nasal congestion and non-productive cough consistent with viral upper respiratory infection. She was noted to have contractions on the monitor every 2-3 minutes, however. Cervical exam was performed and revealed a closed cervix. Patient was given tylenol as well as a 1L LR bolus and continued to be monitored. She continued to have uterine contractions despite the IVF. She was given a dose of terbutaline and continued to push PO fluids. The patient's contractions ceased, and tocometry revealed only mild uterine irritability. Suspect that contractions were related to decreased oral fluid intake in the setting of an acute viral upper respiratory tract infection. Low suspicion for any other infection currently present.   Assessment: 1. Uterine contractions during pregnancy   2. Viral upper respiratory illness     Plan: Provided patient with safe medications to take during pregnancy to help with symptoms of viral upper respiratory tract infection Discharge home Labor precautions and fetal kick counts reviewed Follow up with OB provider within 1 week  Follow-up Information    [facility]   Why:  Call in the morning to schedule a follow-up appointment within the next week Contact information: [address] [place] [address] [phone]           Allergies as of 11/14/2016   No Known Allergies     Medication List    TAKE these medications   promethazine 12.5 MG tablet Commonly known as:  PHENERGAN Take 1 tablet (12.5 mg total) by mouth every 6 (six) hours as needed for nausea or vomiting.   VITAFOL GUMMIES 3.33-0.333-34.8 MG Chew CHEW AND SWALLOW 1 GUMMIES PO TID       Lise AuerMegan C  Campbell, MD PGY-2 11/14/2016 1:35 AM

## 2016-11-13 NOTE — MAU Note (Signed)
Pt presents complaining of a runny nose and cough and thinks she might have a cold. States she has not tried anything for the symptoms. Denies leaking or bleeding. Reports good fetal movement.

## 2016-11-14 NOTE — Discharge Instructions (Signed)
. °Preterm Labor and Birth Information °The normal length of a pregnancy is 39-41 weeks. Preterm labor is when labor starts before 37 completed weeks of pregnancy. °What are the risk factors for preterm labor? °Preterm labor is more likely to occur in women who: °· Have certain infections during pregnancy such as a bladder infection, sexually transmitted infection, or infection inside the uterus (chorioamnionitis). °· Have a shorter-than-normal cervix. °· Have gone into preterm labor before. °· Have had surgery on their cervix. °· Are younger than age 17 or older than age 35. °· Are African American. °· Are pregnant with twins or multiple babies (multiple gestation). °· Take street drugs or smoke while pregnant. °· Do not gain enough weight while pregnant. °· Became pregnant shortly after having been pregnant. °What are the symptoms of preterm labor? °Symptoms of preterm labor include: °· Cramps similar to those that can happen during a menstrual period. The cramps may happen with diarrhea. °· Pain in the abdomen or lower back. °· Regular uterine contractions that may feel like tightening of the abdomen. °· A feeling of increased pressure in the pelvis. °· Increased watery or bloody mucus discharge from the vagina. °· Water breaking (ruptured amniotic sac). °Why is it important to recognize signs of preterm labor? °It is important to recognize signs of preterm labor because babies who are born prematurely may not be fully developed. This can put them at an increased risk for: °· Long-term (chronic) heart and lung problems. °· Difficulty immediately after birth with regulating body systems, including blood sugar, body temperature, heart rate, and breathing rate. °· Bleeding in the brain. °· Cerebral palsy. °· Learning difficulties. °· Death. °These risks are highest for babies who are born before 34 weeks of pregnancy. °How is preterm labor treated? °Treatment depends on the length of your pregnancy, your condition,  and the health of your baby. It may involve: °· Having a stitch (suture) placed in your cervix to prevent your cervix from opening too early (cerclage). °· Taking or being given medicines, such as: °¨ Hormone medicines. These may be given early in pregnancy to help support the pregnancy. °¨ Medicine to stop contractions. °¨ Medicines to help mature the baby’s lungs. These may be prescribed if the risk of delivery is high. °¨ Medicines to prevent your baby from developing cerebral palsy. °If the labor happens before 34 weeks of pregnancy, you may need to stay in the hospital. °What should I do if I think I am in preterm labor? °If you think that you are going into preterm labor, call your health care provider right away. °How can I prevent preterm labor in future pregnancies? °To increase your chance of having a full-term pregnancy: °· Do not use any tobacco products, such as cigarettes, chewing tobacco, and e-cigarettes. If you need help quitting, ask your health care provider. °· Do not use street drugs or medicines that have not been prescribed to you during your pregnancy. °· Talk with your health care provider before taking any herbal supplements, even if you have been taking them regularly. °· Make sure you gain a healthy amount of weight during your pregnancy. °· Watch for infection. If you think that you might have an infection, get it checked right away. °· Make sure to tell your health care provider if you have gone into preterm labor before. °This information is not intended to replace advice given to you by your health care provider. Make sure you discuss any questions you have with your   health care provider. °Document Released: 12/24/2003 Document Revised: 03/15/2016 Document Reviewed: 02/24/2016 °Elsevier Interactive Patient Education © 2017 [employer]vier Inc. ° °

## 2016-11-16 ENCOUNTER — Inpatient Hospital Stay (HOSPITAL_COMMUNITY)
Admission: AD | Admit: 2016-11-16 | Discharge: 2016-11-16 | Disposition: A | Discharge status: Home / Self Care | Payer: Medicaid Other | Source: Ambulatory Visit | Attending: Obstetrics & Gynecology | Admitting: Obstetrics & Gynecology

## 2016-11-16 ENCOUNTER — Inpatient Hospital Stay (HOSPITAL_COMMUNITY): Payer: Medicaid Other

## 2016-11-16 ENCOUNTER — Encounter (HOSPITAL_COMMUNITY): Payer: Self-pay

## 2016-11-16 DIAGNOSIS — Z3A23 23 weeks gestation of pregnancy: Secondary | ICD-10-CM | POA: Diagnosis not present

## 2016-11-16 DIAGNOSIS — O4702 False labor before 37 completed weeks of gestation, second trimester: Secondary | ICD-10-CM

## 2016-11-16 LAB — CBC
HCT: 31.9 % — ABNORMAL LOW (ref 36.0–46.0)
Hemoglobin: 10.8 g/dL — ABNORMAL LOW (ref 12.0–15.0)
MCH: 30.7 pg (ref 26.0–34.0)
MCHC: 33.9 g/dL (ref 30.0–36.0)
MCV: 90.6 fL (ref 78.0–100.0)
PLATELETS: 210 10*3/uL (ref 150–400)
RBC: 3.52 MIL/uL — ABNORMAL LOW (ref 3.87–5.11)
RDW: 14.7 % (ref 11.5–15.5)
WBC: 7.6 10*3/uL (ref 4.0–10.5)

## 2016-11-16 LAB — URINALYSIS, ROUTINE W REFLEX MICROSCOPIC
Bilirubin Urine: NEGATIVE
Glucose, UA: 50 mg/dL — AB
Hgb urine dipstick: NEGATIVE
KETONES UR: NEGATIVE mg/dL
Leukocytes, UA: NEGATIVE
NITRITE: NEGATIVE
PH: 7 (ref 5.0–8.0)
Protein, ur: NEGATIVE mg/dL
Specific Gravity, Urine: 1.018 (ref 1.005–1.030)

## 2016-11-16 LAB — FETAL FIBRONECTIN: Fetal Fibronectin: NEGATIVE

## 2016-11-16 MED ORDER — NIFEDIPINE 10 MG PO CAPS
10.0000 mg | ORAL_CAPSULE | ORAL | Status: DC | PRN
  Administered 2016-11-16 (×3): 10 mg via ORAL
  Filled 2016-11-16 (×3): qty 1

## 2016-11-16 MED ORDER — BETAMETHASONE SOD PHOS & ACET 6 (3-3) MG/ML IJ SUSP
12.0000 mg | Freq: Once | INTRAMUSCULAR | Status: AC
  Administered 2016-11-16: 12 mg via INTRAMUSCULAR
  Filled 2016-11-16: qty 2

## 2016-11-16 MED ORDER — LACTATED RINGERS IV BOLUS (SEPSIS)
1000.0000 mL | Freq: Once | INTRAVENOUS | Status: AC
  Administered 2016-11-16: 1000 mL via INTRAVENOUS

## 2016-11-16 NOTE — MAU Note (Signed)
Since last night, has been having what feels like contractions.

## 2016-11-16 NOTE — MAU Provider Note (Signed)
History     CSN: [id number]  Arrival date and time: 11/16/16 1436   First Provider Initiated Contact with Patient 11/16/16 1527       Chief Complaint  Patient presents with  . Contractions   HPI Kaliann Coryell is a 21 y.o. G1P0 at [redacted]w[redacted]d who presents with contractions. Reports episode of contractions last night that lasted for a few hours but resolved prior to going to bed. Contractions returned this morning. Reports feeling abdominal pain & tightening every 5-10 minutes. Rates pain 8/10. Has not treated. Denies n/v/d, vaginal bleeding, LOF, abdominal trauma, or recent intercourse. Positive fetal movement.   OB History    Gravida Para Term Preterm AB Living   1         0   SAB TAB Ectopic Multiple Live Births                  Past Medical History:  Diagnosis Date  . Acid reflux   . Anemia   . Anxiety   . Ovarian cyst     Past Surgical History:  Procedure Laterality Date  . WISDOM TOOTH EXTRACTION      Family History  Problem Relation Age of Onset  . Heart disease Father     Social History  Substance Use Topics  . Smoking status: Never Smoker  . Smokeless tobacco: Never Used  . Alcohol use No    Allergies: No Known Allergies  Prescriptions Prior to Admission  Medication Sig Dispense Refill Last Dose  . guaiFENesin (MUCINEX) 600 MG 12 hr tablet Take by mouth 2 (two) times daily as needed for cough.   11/15/2016 at Unknown time  . Prenatal Vit-Fe Phos-FA-Omega (VITAFOL GUMMIES) 3.33-0.333-34.8 MG CHEW CHEW AND SWALLOW 1 GUMMIES PO TID  11 11/15/2016 at Unknown time  . promethazine (PHENERGAN) 12.5 MG tablet Take 1 tablet (12.5 mg total) by mouth every 6 (six) hours as needed for nausea or vomiting. (Patient not taking: Reported on 11/08/2016) 30 tablet 0 Not Taking at Unknown time    Review of Systems  Constitutional: Negative.   Gastrointestinal: Positive for abdominal pain. Negative for constipation, diarrhea, nausea and vomiting.  Genitourinary: Negative.     Physical Exam   Blood pressure 92/56, pulse 82, temperature 98.5 F (36.9 C), temperature source Oral, resp. rate 16, height 5\' 5"  (1.651 m), weight 120 lb 3.2 oz (54.5 kg), last menstrual period 05/24/2016.  Temp:  [98.5 F (36.9 C)] 98.5 F (36.9 C) (01/31 1447) Pulse Rate:  [76-82] 82 (01/31 1825) Resp:  [16] 16 (01/31 1447) BP: (92-111)/(56-71) 92/56 (01/31 1825) Weight:  [120 lb 3.2 oz (54.5 kg)] 120 lb 3.2 oz (54.5 kg) (01/31 1447)  Physical Exam  Nursing note and vitals reviewed. Constitutional: She is oriented to person, place, and time. She appears well-developed and well-nourished. No distress.  HENT:  Head: Normocephalic and atraumatic.  Eyes: Conjunctivae are normal. Right eye exhibits no discharge. Left eye exhibits no discharge. No scleral icterus.  Neck: Normal range of motion.  Cardiovascular: Normal rate, regular rhythm and normal heart sounds.   No murmur heard. Respiratory: Effort normal and breath sounds normal. No respiratory distress. She has no wheezes.  GI: Soft. There is no tenderness.  Neurological: She is alert and oriented to person, place, and time.  Skin: Skin is warm and dry. She is not diaphoretic.  Psychiatric: She has a normal mood and affect. Her behavior is normal. Judgment and thought content normal.   Dilation: Closed Exam by:: Denny Peon  Galileah Piggee NP   Fetal Tracing:  Baseline: 150 Variability: moderate Accelerations: 10x10 Decelerations: small variables   Toco: 1-2 mins, 20-60 sec MAU Course  Procedures Results for orders placed or performed during the hospital encounter of 11/16/16 (from the past 24 hour(s))  Urinalysis, Routine w reflex microscopic     Status: Abnormal   Collection Time: 11/16/16  2:50 PM  Result Value Ref Range   Color, Urine YELLOW YELLOW   APPearance HAZY (A) CLEAR   Specific Gravity, Urine 1.018 1.005 - 1.030   pH 7.0 5.0 - 8.0   Glucose, UA 50 (A) NEGATIVE mg/dL   Hgb urine dipstick NEGATIVE NEGATIVE    Bilirubin Urine NEGATIVE NEGATIVE   Ketones, ur NEGATIVE NEGATIVE mg/dL   Protein, ur NEGATIVE NEGATIVE mg/dL   Nitrite NEGATIVE NEGATIVE   Leukocytes, UA NEGATIVE NEGATIVE  Fetal fibronectin     Status: None   Collection Time: 11/16/16  3:48 PM  Result Value Ref Range   Fetal Fibronectin NEGATIVE NEGATIVE  CBC     Status: Abnormal   Collection Time: 11/16/16  7:26 PM  Result Value Ref Range   WBC 7.6 4.0 - 10.5 K/uL   RBC 3.52 (L) 3.87 - 5.11 MIL/uL   Hemoglobin 10.8 (L) 12.0 - 15.0 g/dL   HCT 56.231.9 (L) 13.036.0 - 86.546.0 %   MCV 90.6 78.0 - 100.0 fL   MCH 30.7 26.0 - 34.0 pg   MCHC 33.9 30.0 - 36.0 g/dL   RDW 78.414.7 69.611.5 - 29.515.5 %   Platelets 210 150 - 400 K/uL    MDM Fetal tracing appropriate for gestation SVE 0/thick/-3 -- SVE unchanged after 3 hours U/s -- cervical length 3.24 cm FFN negative IV fluid bolus -- LR x 1.5 liters Procardia 10 mg x 3 doses Pt reports improvement in pain from 8>5 & decrease in frequency S/w Dr. Emelda FearFerguson. Will come talk to patient. Ok to discharge home after receiving BMZ Assessment and Plan  A: 1. [redacted] weeks gestation of pregnancy   2. Preterm uterine contractions in second trimester, antepartum    P: Discharge home Preterm labor precautions Return to MAU tomorrow evening for 2nd BMZ Keep scheduled follow up at [facility] or call office as needed  Judeth Hornrin Gilbert [place] 11/16/2016, 3:27 PM

## 2016-11-16 NOTE — Discharge Instructions (Signed)
. °Preterm Labor and Birth Information °The normal length of a pregnancy is 39-41 weeks. Preterm labor is when labor starts before 37 completed weeks of pregnancy. °What are the risk factors for preterm labor? °Preterm labor is more likely to occur in women who: °· Have certain infections during pregnancy such as a bladder infection, sexually transmitted infection, or infection inside the uterus (chorioamnionitis). °· Have a shorter-than-normal cervix. °· Have gone into preterm labor before. °· Have had surgery on their cervix. °· Are younger than age 17 or older than age 35. °· Are African American. °· Are pregnant with twins or multiple babies (multiple gestation). °· Take street drugs or smoke while pregnant. °· Do not gain enough weight while pregnant. °· Became pregnant shortly after having been pregnant. °What are the symptoms of preterm labor? °Symptoms of preterm labor include: °· Cramps similar to those that can happen during a menstrual period. The cramps may happen with diarrhea. °· Pain in the abdomen or lower back. °· Regular uterine contractions that may feel like tightening of the abdomen. °· A feeling of increased pressure in the pelvis. °· Increased watery or bloody mucus discharge from the vagina. °· Water breaking (ruptured amniotic sac). °Why is it important to recognize signs of preterm labor? °It is important to recognize signs of preterm labor because babies who are born prematurely may not be fully developed. This can put them at an increased risk for: °· Long-term (chronic) heart and lung problems. °· Difficulty immediately after birth with regulating body systems, including blood sugar, body temperature, heart rate, and breathing rate. °· Bleeding in the brain. °· Cerebral palsy. °· Learning difficulties. °· Death. °These risks are highest for babies who are born before 34 weeks of pregnancy. °How is preterm labor treated? °Treatment depends on the length of your pregnancy, your condition,  and the health of your baby. It may involve: °· Having a stitch (suture) placed in your cervix to prevent your cervix from opening too early (cerclage). °· Taking or being given medicines, such as: °¨ Hormone medicines. These may be given early in pregnancy to help support the pregnancy. °¨ Medicine to stop contractions. °¨ Medicines to help mature the baby’s lungs. These may be prescribed if the risk of delivery is high. °¨ Medicines to prevent your baby from developing cerebral palsy. °If the labor happens before 34 weeks of pregnancy, you may need to stay in the hospital. °What should I do if I think I am in preterm labor? °If you think that you are going into preterm labor, call your health care provider right away. °How can I prevent preterm labor in future pregnancies? °To increase your chance of having a full-term pregnancy: °· Do not use any tobacco products, such as cigarettes, chewing tobacco, and e-cigarettes. If you need help quitting, ask your health care provider. °· Do not use street drugs or medicines that have not been prescribed to you during your pregnancy. °· Talk with your health care provider before taking any herbal supplements, even if you have been taking them regularly. °· Make sure you gain a healthy amount of weight during your pregnancy. °· Watch for infection. If you think that you might have an infection, get it checked right away. °· Make sure to tell your health care provider if you have gone into preterm labor before. °This information is not intended to replace advice given to you by your health care provider. Make sure you discuss any questions you have with your   health care provider. °Document Released: 12/24/2003 Document Revised: 03/15/2016 Document Reviewed: 02/24/2016 °Elsevier Interactive Patient Education © 2017 [employer]vier Inc. ° °

## 2016-11-17 ENCOUNTER — Inpatient Hospital Stay (HOSPITAL_COMMUNITY)
Admission: AD | Admit: 2016-11-17 | Discharge: 2016-11-17 | Disposition: A | Discharge status: Home / Self Care | Payer: Medicaid Other | Source: Ambulatory Visit | Attending: Obstetrics and Gynecology | Admitting: Obstetrics and Gynecology

## 2016-11-17 DIAGNOSIS — Z3A24 24 weeks gestation of pregnancy: Secondary | ICD-10-CM | POA: Insufficient documentation

## 2016-11-17 DIAGNOSIS — Z3492 Encounter for supervision of normal pregnancy, unspecified, second trimester: Secondary | ICD-10-CM | POA: Insufficient documentation

## 2016-11-17 MED ORDER — BETAMETHASONE SOD PHOS & ACET 6 (3-3) MG/ML IJ SUSP
12.0000 mg | Freq: Once | INTRAMUSCULAR | Status: AC
  Administered 2016-11-17: 12 mg via INTRAMUSCULAR
  Filled 2016-11-17: qty 2

## 2016-11-17 NOTE — MAU Note (Signed)
Pt here for 2nd dose of BMZ. Pt denies, pain, contractions, vag bleeding or LOF. +FM

## 2016-12-04 ENCOUNTER — Emergency Department (HOSPITAL_COMMUNITY)
Admission: EM | Admit: 2016-12-04 | Discharge: 2016-12-04 | Disposition: A | Discharge status: Home / Self Care | Payer: Medicaid Other | Attending: Emergency Medicine | Admitting: Emergency Medicine

## 2016-12-04 ENCOUNTER — Encounter (HOSPITAL_COMMUNITY): Payer: Self-pay | Admitting: Emergency Medicine

## 2016-12-04 DIAGNOSIS — Z79899 Other long term (current) drug therapy: Secondary | ICD-10-CM | POA: Diagnosis not present

## 2016-12-04 DIAGNOSIS — K0889 Other specified disorders of teeth and supporting structures: Secondary | ICD-10-CM | POA: Diagnosis present

## 2016-12-04 MED ORDER — BUPIVACAINE-EPINEPHRINE (PF) 0.5% -1:200000 IJ SOLN
1.8000 mL | Freq: Once | INTRAMUSCULAR | Status: AC
  Administered 2016-12-04: 1.8 mL

## 2016-12-04 MED ORDER — BUPIVACAINE-EPINEPHRINE (PF) 0.5% -1:200000 IJ SOLN
1.8000 mL | Freq: Once | INTRAMUSCULAR | Status: AC
  Administered 2016-12-04: 1.8 mL
  Filled 2016-12-04: qty 1.8

## 2016-12-04 NOTE — ED Notes (Signed)
Declined W/C at D/C and was escorted to lobby by RN. 

## 2016-12-04 NOTE — ED Triage Notes (Signed)
Pt. Stated, I have some pain in my tooth that was traumatized 2 years ago.

## 2016-12-04 NOTE — Discharge Instructions (Signed)
Take acetaminophen (Tylenol) up to 975 mg (this is normally 3 over-the-counter pills) up to 3 times a day. Do not drink alcohol. Make sure your other medications do not contain acetaminophen (Read the labels!) ° °

## 2016-12-04 NOTE — ED Provider Notes (Signed)
[facility]-EMERGENCY DEPT Provider Note   CSN: [id number] Arrival date & time: 12/04/16  1020     History   Chief Complaint Chief Complaint  Patient presents with  . Dental Pain     HPI  Natasha Good is a 21 y.o. female complaining of left upper dental pain onset 2 days ago she 6 months pregnant, uncomplicated pregnancy and she denies any abdominal pain vaginal bleeding vaginal discharge. She states she had a remote fracture to the tooth and it does give her problems intermittently, she's been taking acetaminophen 500 mg twice a day with some relief but the pain was particularly severe today. She denies fever, chills, nausea, vomiting, shortness of breath or difficulty swallowing.  Past Medical History:  Diagnosis Date  . Acid reflux   . Anemia   . Anxiety   . Ovarian cyst     There are no active problems to display for this patient.   Past Surgical History:  Procedure Laterality Date  . WISDOM TOOTH EXTRACTION      OB History    Gravida Para Term Preterm AB Living   1         0   SAB TAB Ectopic Multiple Live Births                   Home Medications    Prior to Admission medications   Medication Sig Start Date End Date Taking? Authorizing Provider  guaiFENesin (MUCINEX) 600 MG 12 hr tablet Take by mouth 2 (two) times daily as needed for cough.    Historical Provider, MD  Prenatal Vit-Fe Phos-FA-Omega (VITAFOL GUMMIES) 3.33-0.333-34.8 MG CHEW CHEW AND SWALLOW 1 GUMMIES PO TID 10/12/16   Historical Provider, MD  promethazine (PHENERGAN) 12.5 MG tablet Take 1 tablet (12.5 mg total) by mouth every 6 (six) hours as needed for nausea or vomiting. Patient not taking: Reported on 11/08/2016 08/22/16   Elta Guadeloupe, NP    Family History Family History  Problem Relation Age of Onset  . Heart disease Father     Social History Social History  Substance Use Topics  . Smoking status: Never Smoker  . Smokeless tobacco: Never Used  . Alcohol use No     Allergies     Patient has no known allergies.   Review of Systems Review of Systems  10 systems reviewed and found to be negative, except as noted in the HPI.  Physical Exam Updated Vital Signs BP 108/67 (BP Location: Right Arm)   Pulse 75   Temp 98.4 F (36.9 C) (Oral)   Resp 16   Ht 5\' 5"  (1.651 m)   Wt 54.4 kg   LMP 05/24/2016 (Approximate)   SpO2 100%   BMI 19.97 kg/m   Physical Exam  Constitutional: She is oriented to person, place, and time. She appears well-developed and well-nourished. No distress.  HENT:  Head: Normocephalic and atraumatic.  Mouth/Throat: Uvula is midline and oropharynx is clear and moist. No trismus in the jaw. No uvula swelling. No oropharyngeal exudate, posterior oropharyngeal edema, posterior oropharyngeal erythema or tonsillar abscesses.  Generally good dentition, no gingival swelling, erythema or tenderness to palpation. Patient is handling their secretions. There is no tenderness to palpation or firmness underneath tongue bilaterally. No trismus.    Eyes: Conjunctivae and EOM are normal. Pupils are equal, round, and reactive to light.  Neck: Normal range of motion.  Cardiovascular: Normal rate, regular rhythm and intact distal pulses.   Pulmonary/Chest: Effort normal and breath sounds normal.  No stridor.  Abdominal: Soft. There is no tenderness.  Musculoskeletal: Normal range of motion.  Lymphadenopathy:    She has no cervical adenopathy.  Neurological: She is alert and oriented to person, place, and time.  Skin: She is not diaphoretic.  Psychiatric: She has a normal mood and affect.  Nursing note and vitals reviewed.    ED Treatments / Results  Labs (all labs ordered are listed, but only abnormal results are displayed) Labs Reviewed - No data to display  EKG  EKG Interpretation None       Radiology No results found.  Procedures .Nerve Block Date/Time: 12/04/2016 11:57 AM Performed by: Wynetta EmeryPISCIOTTA, Zira Helinski Authorized by: Wynetta EmeryPISCIOTTA,  Aditya Nastasi   Consent:    Consent obtained:  Verbal   Consent given by:  Patient   Risks discussed:  Pain   Alternatives discussed:  No treatment Indications:    Indications:  Pain relief Location:    Nerve block body site: periosteal tooth # 9. Procedure details (see MAR for exact dosages):    Anesthetic injected:  Bupivacaine 0.5% WITH epi (1mL)   Injection procedure:  Anatomic landmarks identified   Paresthesia:  None Post-procedure details:    Dressing:  None   Outcome:  Pain relieved   Patient tolerance of procedure:  Tolerated well, no immediate complications   (including critical care time)  Medications Ordered in ED Medications  bupivacaine-epinephrine (MARCAINE W/ EPI) 0.5% -1:200000 injection 1.8 mL (not administered)  bupivacaine-epinephrine (MARCAINE W/ EPI) 0.5% -1:200000 injection 1.8 mL (1.8 mLs Infiltration Given 12/04/16 1121)     Initial Impression / Assessment and Plan / ED Course  I have reviewed the triage vital signs and the nursing notes.  Pertinent labs & imaging results that were available during my care of the patient were reviewed by me and considered in my medical decision making (see chart for details).     Vitals:   12/04/16 1037 12/04/16 1127  BP:  108/67  Pulse:  75  Resp:  16  Temp:  98.4 F (36.9 C)  TempSrc:  Oral  SpO2:  100%  Weight: 54.4 kg   Height: 5\' 5"  (1.651 m)     Medications  bupivacaine-epinephrine (MARCAINE W/ EPI) 0.5% -1:200000 injection 1.8 mL (not administered)  bupivacaine-epinephrine (MARCAINE W/ EPI) 0.5% -1:200000 injection 1.8 mL (1.8 mLs Infiltration Given 12/04/16 1121)    Natasha Good is 21 y.o. female presenting with dental pain to tooth that was fractured in the remote past. No signs of infection, dental block performed and patient is counseled on maximizing acetaminophen dosage safely. Will follow with dentist later this week.  Evaluation does not show pathology that would require ongoing emergent  intervention or inpatient treatment. Pt is hemodynamically stable and mentating appropriately. Discussed findings and plan with patient/guardian, who agrees with care plan. All questions answered. Return precautions discussed and outpatient follow up given.    Final Clinical Impressions(s) / ED Diagnoses   Final diagnoses:  Pain, dental    New Prescriptions New Prescriptions   No medications on file     Wynetta Emeryicole Jahmai Finelli, PA-C 12/04/16 1200    Doug SouSam Jacubowitz, MD 12/04/16 1702

## 2016-12-06 ENCOUNTER — Encounter (HOSPITAL_COMMUNITY): Payer: Self-pay

## 2016-12-06 ENCOUNTER — Inpatient Hospital Stay (HOSPITAL_COMMUNITY)
Admission: AD | Admit: 2016-12-06 | Discharge: 2016-12-06 | Disposition: A | Discharge status: Home / Self Care | Payer: Medicaid Other | Source: Ambulatory Visit | Attending: Family Medicine | Admitting: Family Medicine

## 2016-12-06 DIAGNOSIS — M5431 Sciatica, right side: Secondary | ICD-10-CM | POA: Insufficient documentation

## 2016-12-06 DIAGNOSIS — M549 Dorsalgia, unspecified: Secondary | ICD-10-CM

## 2016-12-06 DIAGNOSIS — O26892 Other specified pregnancy related conditions, second trimester: Secondary | ICD-10-CM | POA: Diagnosis not present

## 2016-12-06 DIAGNOSIS — Z3A26 26 weeks gestation of pregnancy: Secondary | ICD-10-CM | POA: Diagnosis not present

## 2016-12-06 DIAGNOSIS — O9989 Other specified diseases and conditions complicating pregnancy, childbirth and the puerperium: Secondary | ICD-10-CM

## 2016-12-06 MED ORDER — CYCLOBENZAPRINE HCL 10 MG PO TABS: 10.0000 mg | ORAL_TABLET | Freq: Three times a day (TID) | ORAL | 0 refills | Status: DC | PRN

## 2016-12-06 MED ORDER — IBUPROFEN 600 MG PO TABS: 600.0000 mg | ORAL_TABLET | Freq: Four times a day (QID) | ORAL | 0 refills | Status: DC | PRN

## 2016-12-06 MED ORDER — IBUPROFEN 600 MG PO TABS
600.0000 mg | ORAL_TABLET | Freq: Once | ORAL | Status: AC
  Administered 2016-12-06: 600 mg via ORAL
  Filled 2016-12-06: qty 1

## 2016-12-06 MED ORDER — CYCLOBENZAPRINE HCL 5 MG PO TABS
5.0000 mg | ORAL_TABLET | Freq: Once | ORAL | Status: AC
  Administered 2016-12-06: 5 mg via ORAL
  Filled 2016-12-06: qty 1

## 2016-12-06 NOTE — MAU Note (Signed)
About a wk ago has been having pain in her right hip, down in to her leg. Pain is constant, getting worse.  Received Betamethasone injections about a month ago, thought it may have been related. Very hard to walk.

## 2016-12-06 NOTE — Discharge Instructions (Signed)
Sciatica Sciatica is pain, numbness, weakness, or tingling along the path of the sciatic nerve. The sciatic nerve starts in the lower back and runs down the back of each leg. The nerve controls the muscles in the lower leg and in the back of the knee. It also provides feeling (sensation) to the back of the thigh, the lower leg, and the sole of the foot. Sciatica is a symptom of another medical condition that pinches or puts pressure on the sciatic nerve. Generally, sciatica only affects one side of the body. Sciatica usually goes away on its own or with treatment. In some cases, sciatica may keep coming back (recur). What are the causes? This condition is caused by pressure on the sciatic nerve, or pinching of the sciatic nerve. This may be the result of:  A disk in between the bones of the spine (vertebrae) bulging out too far (herniated disk).  Age-related changes in the spinal disks (degenerative disk disease).  A pain disorder that affects a muscle in the buttock (piriformis syndrome).  Extra bone growth (bone spur) near the sciatic nerve.  An injury or break (fracture) of the pelvis.  Pregnancy.  Tumor (rare). What increases the risk? The following factors may make you more likely to develop this condition:  Playing sports that place pressure or stress on the spine, such as football or weight lifting.  Having poor strength and flexibility.  A history of back injury.  A history of back surgery.  Sitting for long periods of time.  Doing activities that involve repetitive bending or lifting.  Obesity. What are the signs or symptoms? Symptoms can vary from mild to very severe, and they may include:  Any of these problems in the lower back, leg, hip, or buttock:  Mild tingling or dull aches.  Burning sensations.  Sharp pains.  Numbness in the back of the calf or the sole of the foot.  Leg weakness.  Severe back pain that makes movement difficult. These symptoms may  get worse when you cough, sneeze, or laugh, or when you sit or stand for long periods of time. Being overweight may also make symptoms worse. In some cases, symptoms may recur over time. How is this diagnosed? This condition may be diagnosed based on:  Your symptoms.  A physical exam. Your health care provider may ask you to do certain movements to check whether those movements trigger your symptoms.  You may have tests, including:  Blood tests.  X-rays.  MRI.  CT scan. How is this treated? In many cases, this condition improves on its own, without any treatment. However, treatment may include:  Reducing or modifying physical activity during periods of pain.  Exercising and stretching to strengthen your abdomen and improve the flexibility of your spine.  Icing and applying heat to the affected area.  Medicines that help:  To relieve pain and swelling.  To relax your muscles.  Injections of medicines that help to relieve pain, irritation, and inflammation around the sciatic nerve (steroids).  Surgery. Follow these instructions at home: Medicines   Take over-the-counter and prescription medicines only as told by your health care provider.  Do not drive or operate heavy machinery while taking prescription pain medicine. Managing pain   If directed, apply ice to the affected area.  Put ice in a plastic bag.  Place a towel between your skin and the bag.  Leave the ice on for 20 minutes, 2-3 times a day.  After icing, apply heat to the   affected area before you exercise or as often as told by your health care provider. Use the heat source that your health care provider recommends, such as a moist heat pack or a heating pad.  Place a towel between your skin and the heat source.  Leave the heat on for 20-30 minutes.  Remove the heat if your skin turns bright red. This is especially important if you are unable to feel pain, heat, or cold. You may have a greater risk of  getting burned. Activity   Return to your normal activities as told by your health care provider. Ask your health care provider what activities are safe for you.  Avoid activities that make your symptoms worse.  Take brief periods of rest throughout the day. Resting in a lying or standing position is usually better than sitting to rest.  When you rest for longer periods, mix in some mild activity or stretching between periods of rest. This will help to prevent stiffness and pain.  Avoid sitting for long periods of time without moving. Get up and move around at least one time each hour.  Exercise and stretch regularly, as told by your health care provider.  Do not lift anything that is heavier than 10 lb (4.5 kg) while you have symptoms of sciatica. When you do not have symptoms, you should still avoid heavy lifting, especially repetitive heavy lifting.  When you lift objects, always use proper lifting technique, which includes:  Bending your knees.  Keeping the load close to your body.  Avoiding twisting. General instructions   Use good posture.  Avoid leaning forward while sitting.  Avoid hunching over while standing.  Maintain a healthy weight. Excess weight puts extra stress on your back and makes it difficult to maintain good posture.  Wear supportive, comfortable shoes. Avoid wearing high heels.  Avoid sleeping on a mattress that is too soft or too hard. A mattress that is firm enough to support your back when you sleep may help to reduce your pain.  Keep all follow-up visits as told by your health care provider. This is important. Contact a health care provider if:  You have pain that wakes you up when you are sleeping.  You have pain that gets worse when you lie down.  Your pain is worse than you have experienced in the past.  Your pain lasts longer than 4 weeks.  You experience unexplained weight loss. Get help right away if:  You lose control of your bowel  or bladder (incontinence).  You have:  Weakness in your lower back, pelvis, buttocks, or legs that gets worse.  Redness or swelling of your back.  A burning sensation when you urinate. This information is not intended to replace advice given to you by your health care provider. Make sure you discuss any questions you have with your health care provider. Document Released: 09/27/2001 Document Revised: 03/08/2016 Document Reviewed: 06/12/2015 Elsevier Interactive Patient Education  2017 [employer]  

## 2016-12-06 NOTE — MAU Note (Signed)
Urine sent to lab 

## 2016-12-06 NOTE — MAU Provider Note (Signed)
Chief Complaint:  Hip Pain   First Provider Initiated Contact with Patient 12/06/16 1610     HPI: Natasha Good is a 21 y.o. G1P0 at 6053w5d who presents to maternity admissions reporting back pain radiating down legs x several weeks. Had pain in left buttock radiating down side of left leg starting a few weeks ago. That resolved and pt starting having similar pain in right low back/buttock radiating down right side several days ago. Had BMZ injections 1/31 and 2/1. Wonders if injections caused pain. Only had mild pain during injections w/out radiation of pain down either leg.    Location: low back/buttock Quality: sharp Severity: 10/10 in pain scale Duration: 2 weeks Context: After BMZ injections Timing: Intermittent Modifying factors: Worse w/ walking Difficult to ambulate Associated signs and symptoms: None Radiated down leg  Denies contractions, leakage of fluid or vaginal bleeding. Good fetal movement.    Past Medical History:  Diagnosis Date  . Acid reflux   . Anemia   . Anxiety   . Ovarian cyst    OB History  Gravida Para Term Preterm AB Living  1         0  SAB TAB Ectopic Multiple Live Births               # Outcome Date GA Lbr Len/2nd Weight Sex Delivery Anes PTL Lv  1 Current              Past Surgical History:  Procedure Laterality Date  . WISDOM TOOTH EXTRACTION     Family History  Problem Relation Age of Onset  . Heart disease Father    Social History  Substance Use Topics  . Smoking status: Never Smoker  . Smokeless tobacco: Never Used  . Alcohol use No   No Known Allergies Prescriptions Prior to Admission  Medication Sig Dispense Refill Last Dose  . amoxicillin (AMOXIL) 500 MG capsule Take 500 mg by mouth 4 (four) times daily.   12/06/2016 at Unknown time  . Prenatal Vit-Fe Fumarate-FA (PRENATAL MULTIVITAMIN) TABS tablet Take 1 tablet by mouth daily at 12 noon.   12/06/2016 at Unknown time  . promethazine (PHENERGAN) 12.5 MG tablet Take 1 tablet (12.5  mg total) by mouth every 6 (six) hours as needed for nausea or vomiting. (Patient not taking: Reported on 11/08/2016) 30 tablet 0 Not Taking at Unknown time    I have reviewed patient's Past Medical Hx, Surgical Hx, Family Hx, Social Hx, medications and allergies.   ROS:  Review of Systems  Constitutional: Negative for chills and fever.  Gastrointestinal: Negative for abdominal pain.  Genitourinary: Negative for vaginal bleeding.  Musculoskeletal: Positive for back pain. Negative for gait problem (Difficulty walking due to pain) and myalgias.  Neurological: Negative for numbness.    Physical Exam  Patient Vitals for the past 24 hrs:  BP Temp Temp src Pulse Resp  12/06/16 1454 109/62 99.4 F (37.4 C) Oral 73 18   Constitutional: Well-developed, well-nourished female in mild distress.  Cardiovascular: normal rate Respiratory: normal effort MS: Extremities nontender, no edema, normal ROM Neurologic: Alert and oriented x 4.  GU: Deferred FHT:  Baseline 150 , moderate variability, accelerations present, no decelerations Contractions: None   Labs: No results found for this or any previous visit (from the past 24 hour(s)).  Imaging:  NA  MAU Course: Orders Placed This Encounter  Procedures  . Apply heat to affected area   Meds ordered this encounter  Medications  . amoxicillin (AMOXIL) 500  MG capsule    Sig: Take 500 mg by mouth 4 (four) times daily.  . Prenatal Vit-Fe Fumarate-FA (PRENATAL MULTIVITAMIN) TABS tablet    Sig: Take 1 tablet by mouth daily at 12 noon.  Marland Kitchen ibuprofen (ADVIL,MOTRIN) tablet 600 mg  . cyclobenzaprine (FLEXERIL) tablet 5 mg  . cyclobenzaprine (FLEXERIL) 10 MG tablet    Sig: Take 1 tablet (10 mg total) by mouth 3 (three) times daily as needed for muscle spasms.    Dispense:  30 tablet    Refill:  0    Order Specific Question:   Supervising Provider    Answer:   Adam Phenix [[id number]]  . ibuprofen (ADVIL,MOTRIN) 600 MG tablet    Sig: Take 1 tablet  (600 mg total) by mouth every 6 (six) hours as needed.    Dispense:  20 tablet    Refill:  0    Order Specific Question:   Supervising Provider    Answer:   Adam Phenix [[id number]]   Minimal improvement in pain, but ready to go home.  MDM: - LBP radiating down leg C/W sciatica. No immediate pain w/ injections. Very low concern that injections caused Sx.   Assessment: 1. Sciatica of right side   2. Back pain affecting pregnancy in second trimester     Plan: Discharge home in stable condition.  Preterm Labor precautions and fetal kick counts. May need PT referral. Apply ice  Follow-up Information    [facility] Follow up in 3 week(s).   Why:  for prenatal visit  Contact information: [address] [place] [address] [phone]        Primary care provider Follow up.   Why:  as needed if symptoms worsen       [facility] [place] [facility] Follow up.   Why:  as needed for pregnancy emergencies Contact information: [address] [id number] mc [place] [address] [phone]          Allergies as of 12/06/2016   No Known Allergies     Medication List    STOP taking these medications   promethazine 12.5 MG tablet Commonly known as:  PHENERGAN     TAKE these medications   amoxicillin 500 MG capsule Commonly known as:  AMOXIL Take 500 mg by mouth 4 (four) times daily.   cyclobenzaprine 10 MG tablet Commonly known as:  FLEXERIL Take 1 tablet (10 mg total) by mouth 3 (three) times daily as needed for muscle spasms.   ibuprofen 600 MG tablet Commonly known as:  ADVIL,MOTRIN Take 1 tablet (600 mg total) by mouth every 6 (six) hours as needed.   prenatal multivitamin Tabs tablet Take 1 tablet by mouth daily at 12 noon.       [place], [place] 12/06/2016 5:57 PM

## 2016-12-15 LAB — OB RESULTS CONSOLE RPR: RPR: NONREACTIVE

## 2017-01-24 ENCOUNTER — Inpatient Hospital Stay (HOSPITAL_COMMUNITY)
Admission: AD | Admit: 2017-01-24 | Discharge: 2017-01-24 | Disposition: A | Discharge status: Home / Self Care | Payer: Medicaid Other | Source: Ambulatory Visit | Attending: Family Medicine | Admitting: Family Medicine

## 2017-01-24 ENCOUNTER — Encounter (HOSPITAL_COMMUNITY): Payer: Self-pay | Admitting: *Deleted

## 2017-01-24 DIAGNOSIS — O4703 False labor before 37 completed weeks of gestation, third trimester: Secondary | ICD-10-CM | POA: Diagnosis present

## 2017-01-24 DIAGNOSIS — Z3A33 33 weeks gestation of pregnancy: Secondary | ICD-10-CM | POA: Insufficient documentation

## 2017-01-24 DIAGNOSIS — K219 Gastro-esophageal reflux disease without esophagitis: Secondary | ICD-10-CM | POA: Diagnosis not present

## 2017-01-24 DIAGNOSIS — O99343 Other mental disorders complicating pregnancy, third trimester: Secondary | ICD-10-CM | POA: Diagnosis not present

## 2017-01-24 DIAGNOSIS — F419 Anxiety disorder, unspecified: Secondary | ICD-10-CM | POA: Diagnosis not present

## 2017-01-24 DIAGNOSIS — O99613 Diseases of the digestive system complicating pregnancy, third trimester: Secondary | ICD-10-CM | POA: Diagnosis not present

## 2017-01-24 DIAGNOSIS — O99013 Anemia complicating pregnancy, third trimester: Secondary | ICD-10-CM | POA: Diagnosis not present

## 2017-01-24 DIAGNOSIS — O3483 Maternal care for other abnormalities of pelvic organs, third trimester: Secondary | ICD-10-CM | POA: Insufficient documentation

## 2017-01-24 DIAGNOSIS — O479 False labor, unspecified: Secondary | ICD-10-CM

## 2017-01-24 LAB — URINALYSIS, ROUTINE W REFLEX MICROSCOPIC
Bilirubin Urine: NEGATIVE
Glucose, UA: >=500 mg/dL — AB
Hgb urine dipstick: NEGATIVE
KETONES UR: NEGATIVE mg/dL
Nitrite: NEGATIVE
PROTEIN: NEGATIVE mg/dL
Specific Gravity, Urine: 1.017 (ref 1.005–1.030)
pH: 6 (ref 5.0–8.0)

## 2017-01-24 LAB — FETAL FIBRONECTIN: FETAL FIBRONECTIN: NEGATIVE

## 2017-01-24 MED ORDER — NIFEDIPINE 10 MG PO CAPS: 10.0000 mg | ORAL_CAPSULE | Freq: Four times a day (QID) | ORAL | 0 refills | Status: DC | PRN

## 2017-01-24 MED ORDER — NIFEDIPINE 10 MG PO CAPS
10.0000 mg | ORAL_CAPSULE | ORAL | Status: AC | PRN
  Administered 2017-01-24 (×3): 10 mg via ORAL
  Filled 2017-01-24 (×3): qty 1

## 2017-01-24 NOTE — MAU Provider Note (Signed)
Service: Family Medicine Editor: Lovena Neighbours, MD (Resident) Status: Cosign Needed  Hide copied text Hover for attribution information Chief Complaint:  No chief complaint on file.   First Provider Initiated Contact with Patient 01/24/17 1311      HPI: Natasha Good is a 21 y.o. G1P0 at [redacted]w[redacted]d who presents to maternity admissions reporting strong contractions. Patient reports that she has been experiencing contractions for the past few days intermittently, they have felt strong but would resolve on their own. Since Sunday patient feels that contractions have been stronger and more frequent. Today, she has been contracting all day, and the contractions have been stronger with some pressure in her pelvic and pain. Patient has had an uneventful pregnancy with no medical problem. Patient is not taking any medications during this pregnancy. Patient denies any vaginal bleeding and fluid leakage She denies any headaches, fever, chills, abdominal pain, dysuria, urinary frequency, palpitations and dizziness. Patient report good fetal movement.   Denies contractions, leakage of fluid or vaginal bleeding. Good fetal movement.   Pregnancy Course:   Past Medical History:     Past Medical History:  Diagnosis Date  . Acid reflux   . Anemia   . Anxiety   . Ovarian cyst     Past obstetric history: OB History  Gravida Para Term Preterm AB Living  1         0  SAB TAB Ectopic Multiple Live Births               # Outcome Date GA Lbr Len/2nd Weight Sex Delivery Anes PTL Lv  1 Current               Past Surgical History:      Past Surgical History:  Procedure Laterality Date  . WISDOM TOOTH EXTRACTION       Family History:      Family History  Problem Relation Age of Onset  . Heart disease Father     Social History:     Social History  Substance Use Topics  . Smoking status: Never Smoker  . Smokeless tobacco: Never Used  . Alcohol use No     Allergies: No Known Allergies  Meds:         Prescriptions Prior to Admission  Medication Sig Dispense Refill Last Dose  . cyclobenzaprine (FLEXERIL) 10 MG tablet Take 1 tablet (10 mg total) by mouth 3 (three) times daily as needed for muscle spasms. (Patient not taking: Reported on 01/24/2017) 30 tablet 0 Not Taking at Unknown time  . ibuprofen (ADVIL,MOTRIN) 600 MG tablet Take 1 tablet (600 mg total) by mouth every 6 (six) hours as needed. (Patient not taking: Reported on 01/24/2017) 20 tablet 0 Not Taking at Unknown time  . Prenatal Vit-Fe Fumarate-FA (PRENATAL MULTIVITAMIN) TABS tablet Take 1 tablet by mouth daily at 12 noon.   12/06/2016 at Unknown time    I have reviewed patient's Past Medical Hx, Surgical Hx, Family Hx, Social Hx, medications and allergies.   ROS:  A comprehensive ROS was negative except per HPI.    Physical Exam  Patient Vitals for the past 24 hrs:  BP Temp Temp src Pulse Resp SpO2 Height Weight  01/24/17 1302 115/78 99 F (37.2 C) Oral 88 18 100 %  (1.651 m) 134 lb (60.8 kg)   Constitutional: Well-developed, well-nourished female in no acute distress.  Cardiovascular: normal rate Respiratory: normal effort GI: Abd soft, non-tender, gravid appropriate for gestational age. Pos BS x 4 MS:  Extremities nontender, no edema, normal ROM Neurologic: Alert and oriented x 4.  Pelvic: NEFG, physiologic discharge, no blood, cervix clean. No CMT     Labs:      Results for orders placed or performed during the hospital encounter of 01/24/17 (from the past 24 hour(s))  Urinalysis, Routine w reflex microscopic     Status: Abnormal   Collection Time: 01/24/17 12:53 PM  Result Value Ref Range   Color, Urine YELLOW YELLOW   APPearance HAZY (A) CLEAR   Specific Gravity, Urine 1.017 1.005 - 1.030   pH 6.0 5.0 - 8.0   Glucose, UA >=500 (A) NEGATIVE mg/dL   Hgb urine dipstick NEGATIVE NEGATIVE   Bilirubin Urine NEGATIVE NEGATIVE    Ketones, ur NEGATIVE NEGATIVE mg/dL   Protein, ur NEGATIVE NEGATIVE mg/dL   Nitrite NEGATIVE NEGATIVE   Leukocytes, UA TRACE (A) NEGATIVE   RBC / HPF 0-5 0 - 5 RBC/hpf   WBC, UA 0-5 0 - 5 WBC/hpf   Bacteria, UA RARE (A) NONE SEEN   Squamous Epithelial / LPF 0-5 (A) NONE SEEN   Mucous PRESENT     Imaging:  No results found.  MAU Course: --Urine was collected for UA.  --Patient was started on procardia --PO hydration was encouraged  --Fetal fibronectin was collected   I personally reviewed the patient's NST today, found to be REACTIVE. 145 bpm, mod var, +accels, no decels. CTX: q2-51min.   MDM: Plan of care reviewed with patient, including labs and tests ordered and medical treatment.   Assessment: Prelabor contractions. Patient presented with contractions for the past few days with increase frequency and intensity in the past two days consistent with prelabor contractions. Patient does not show any signs of dehydration and clinically shows no signs of infection such as UTI with a normal UA. Patient does not have any fluid leakage or vaginal bleeding. Pelvic exam was unremarkable, cervix was closed, 70% effaced and station is -2 or higher. Given symptoms and gestational age will further evaluate patient to rule out prelabor.  Plan: --Order Procardia 10 mg --Collect fetal fibronectin if negative and contraction improve with procardia --Will discharge home with a few dose of procardia and return precautions if symptoms continue or worsens.  Discharge home in stable condition.   Labor precautions and fetal kick counts    Allergies as of 01/24/2017   No Known Allergies       Lovena Neighbours, MD OB Fellow Center for [facility], [facility] 01/24/2017 1:57 PM     Midwife attestation:  I have seen and examined this patient; I agree with above documentation in the resident's note.   Natasha Good is a 21 y.o. G1P0 reporting  cramping/contractions x 2 days, worsening today. +FM, denies LOF, VB, vaginal discharge.  PE: BP (!) 101/58   Pulse 90   Temp 99 F (37.2 C) (Oral)   Resp 18   Ht  (1.651 m)   Wt 134 lb (60.8 kg)   LMP 05/24/2016 (Approximate)   SpO2 100%   BMI 22.30 kg/m  Gen: calm comfortable, NAD Resp: normal effort, no distress Abd: gravid  ROS, labs, PMH reviewed NST reactive  MDM: Procardia 10 mg PO x 3 doses Q 20 minutes given with reduction in contractions on toco and per pt report.  Cervix remained closed in MAU.  Rx for Procardia 10 mg Q 6 hours PRN x 15 tabs.  Return to MAU as needed for emergencies.  A/P: Follow up in office  as scheduled, return to MAU for emergencies  Sharen Counter, CNM  8:17 PM

## 2017-01-24 NOTE — H&P (Deleted)
Chief Complaint:  No chief complaint on file.   First Provider Initiated Contact with Patient 01/24/17 1311      HPI: Natasha Good is a 21 y.o. G1P0 at [redacted]w[redacted]d who presents to maternity admissions reporting strong contractions. Patient reports that she has been experiencing contractions for the past few days intermittently, they have felt strong but would resolve on their own. Since Sunday patient feels that contractions have been stronger and more frequent. Today, she has been contracting all day, and the contractions have been stronger with some pressure in her pelvic and pain. Patient has had an uneventful pregnancy with no medical problem. Patient is not taking any medications during this pregnancy. Patient denies any vaginal bleeding and fluid leakage She denies any headaches, fever, chills, abdominal pain, dysuria, urinary frequency, palpitations and dizziness. Patient report good fetal movement.   Denies contractions, leakage of fluid or vaginal bleeding. Good fetal movement.   Pregnancy Course:   Past Medical History: Past Medical History:  Diagnosis Date  . Acid reflux   . Anemia   . Anxiety   . Ovarian cyst     Past obstetric history: OB History  Gravida Para Term Preterm AB Living  1         0  SAB TAB Ectopic Multiple Live Births               # Outcome Date GA Lbr Len/2nd Weight Sex Delivery Anes PTL Lv  1 Current               Past Surgical History: Past Surgical History:  Procedure Laterality Date  . WISDOM TOOTH EXTRACTION       Family History: Family History  Problem Relation Age of Onset  . Heart disease Father     Social History: Social History  Substance Use Topics  . Smoking status: Never Smoker  . Smokeless tobacco: Never Used  . Alcohol use No    Allergies: No Known Allergies  Meds:  Prescriptions Prior to Admission  Medication Sig Dispense Refill Last Dose  . cyclobenzaprine (FLEXERIL) 10 MG tablet Take 1 tablet (10 mg total) by mouth 3  (three) times daily as needed for muscle spasms. (Patient not taking: Reported on 01/24/2017) 30 tablet 0 Not Taking at Unknown time  . ibuprofen (ADVIL,MOTRIN) 600 MG tablet Take 1 tablet (600 mg total) by mouth every 6 (six) hours as needed. (Patient not taking: Reported on 01/24/2017) 20 tablet 0 Not Taking at Unknown time  . Prenatal Vit-Fe Fumarate-FA (PRENATAL MULTIVITAMIN) TABS tablet Take 1 tablet by mouth daily at 12 noon.   12/06/2016 at Unknown time    I have reviewed patient's Past Medical Hx, Surgical Hx, Family Hx, Social Hx, medications and allergies.   ROS:  A comprehensive ROS was negative except per HPI.    Physical Exam  Patient Vitals for the past 24 hrs:  BP Temp Temp src Pulse Resp SpO2 Height Weight  01/24/17 1302 115/78 99 F (37.2 C) Oral 88 18 100 %  (1.651 m) 134 lb (60.8 kg)   Constitutional: Well-developed, well-nourished female in no acute distress.  Cardiovascular: normal rate Respiratory: normal effort GI: Abd soft, non-tender, gravid appropriate for gestational age. Pos BS x 4 MS: Extremities nontender, no edema, normal ROM Neurologic: Alert and oriented x 4.  Pelvic: NEFG, physiologic discharge, no blood, cervix clean. No CMT     Labs: Results for orders placed or performed during the hospital encounter of 01/24/17 (from the past 24  hour(s))  Urinalysis, Routine w reflex microscopic     Status: Abnormal   Collection Time: 01/24/17 12:53 PM  Result Value Ref Range   Color, Urine YELLOW YELLOW   APPearance HAZY (A) CLEAR   Specific Gravity, Urine 1.017 1.005 - 1.030   pH 6.0 5.0 - 8.0   Glucose, UA >=500 (A) NEGATIVE mg/dL   Hgb urine dipstick NEGATIVE NEGATIVE   Bilirubin Urine NEGATIVE NEGATIVE   Ketones, ur NEGATIVE NEGATIVE mg/dL   Protein, ur NEGATIVE NEGATIVE mg/dL   Nitrite NEGATIVE NEGATIVE   Leukocytes, UA TRACE (A) NEGATIVE   RBC / HPF 0-5 0 - 5 RBC/hpf   WBC, UA 0-5 0 - 5 WBC/hpf   Bacteria, UA RARE (A) NONE SEEN   Squamous  Epithelial / LPF 0-5 (A) NONE SEEN   Mucous PRESENT     Imaging:  No results found.  MAU Course: --Urine was collected for UA.  --Patient was started on procardia --PO hydration was encouraged  --Fetal fibronectin was collected   I personally reviewed the patient's NST today, found to be REACTIVE. 145 bpm, mod var, +accels, no decels. CTX: q2-20min.   MDM: Plan of care reviewed with patient, including labs and tests ordered and medical treatment.   Assessment: Prelabor contractions. Patient presented with contractions for the past few days with increase frequency and intensity in the past two days consistent with prelabor contractions. Patient does not show any signs of dehydration and clinically shows no signs of infection such as UTI with a normal UA. Patient does not have any fluid leakage or vaginal bleeding. Pelvic exam was unremarkable, cervix was closed, 70% effaced and station is -2 or higher. Given symptoms and gestational age will further evaluate patient to rule out prelabor.  Plan: --Order Procardia 10 mg --Collect fetal fibronectin if negative and contraction improve with procardia --Will discharge home with a few dose of procardia and return precautions if symptoms continue or worsens.  Discharge home in stable condition.   Labor precautions and fetal kick counts    Allergies as of 01/24/2017   No Known Allergies       Lovena Neighbours, MD OB Fellow Center for [facility], [facility] 01/24/2017 1:57 PM

## 2017-01-24 NOTE — MAU Note (Signed)
Pt reports contractions since this am that are 10/10, have not eased up. Denies bleeding or ROM

## 2017-01-28 ENCOUNTER — Encounter (HOSPITAL_COMMUNITY): Payer: Self-pay

## 2017-01-28 ENCOUNTER — Inpatient Hospital Stay (HOSPITAL_COMMUNITY)
Admission: AD | Admit: 2017-01-28 | Discharge: 2017-01-28 | Disposition: A | Discharge status: Home / Self Care | Payer: Medicaid Other | Source: Ambulatory Visit | Attending: Obstetrics & Gynecology | Admitting: Obstetrics & Gynecology

## 2017-01-28 DIAGNOSIS — Z3A34 34 weeks gestation of pregnancy: Secondary | ICD-10-CM | POA: Diagnosis not present

## 2017-01-28 DIAGNOSIS — O212 Late vomiting of pregnancy: Secondary | ICD-10-CM | POA: Diagnosis not present

## 2017-01-28 DIAGNOSIS — O4703 False labor before 37 completed weeks of gestation, third trimester: Secondary | ICD-10-CM | POA: Diagnosis not present

## 2017-01-28 DIAGNOSIS — R112 Nausea with vomiting, unspecified: Secondary | ICD-10-CM

## 2017-01-28 LAB — URINALYSIS, ROUTINE W REFLEX MICROSCOPIC
BACTERIA UA: NONE SEEN
BILIRUBIN URINE: NEGATIVE
GLUCOSE, UA: 50 mg/dL — AB
HGB URINE DIPSTICK: NEGATIVE
KETONES UR: NEGATIVE mg/dL
NITRITE: NEGATIVE
PROTEIN: NEGATIVE mg/dL
Specific Gravity, Urine: 1.018 (ref 1.005–1.030)
pH: 6 (ref 5.0–8.0)

## 2017-01-28 MED ORDER — NIFEDIPINE 10 MG PO CAPS
10.0000 mg | ORAL_CAPSULE | Freq: Once | ORAL | Status: AC
  Administered 2017-01-28: 10 mg via ORAL
  Filled 2017-01-28: qty 1

## 2017-01-28 MED ORDER — ONDANSETRON 4 MG PO TBDP: 4.0000 mg | ORAL_TABLET | Freq: Three times a day (TID) | ORAL | 0 refills | Status: DC | PRN

## 2017-01-28 MED ORDER — ONDANSETRON 8 MG PO TBDP
8.0000 mg | ORAL_TABLET | Freq: Once | ORAL | Status: AC
  Administered 2017-01-28: 8 mg via ORAL
  Filled 2017-01-28: qty 1

## 2017-01-28 NOTE — MAU Note (Signed)
Pt has not felt well the last few days and had nausea and vomiting the last couple of days. Still feeling ctx and taking procardia.

## 2017-01-28 NOTE — MAU Provider Note (Signed)
History   G1P0 in with nausea, vomiting x 2 days and contractions despite taking procardia as directed. While interviewing pt she was texting on her phone.  CSN: [id number]  Arrival date & time 01/28/17  1500   None     Chief Complaint  Patient presents with  . Contractions  . Nausea  . Emesis    HPI  Past Medical History:  Diagnosis Date  . Acid reflux   . Anemia   . Anxiety   . Ovarian cyst     Past Surgical History:  Procedure Laterality Date  . WISDOM TOOTH EXTRACTION      Family History  Problem Relation Age of Onset  . Heart disease Father     Social History  Substance Use Topics  . Smoking status: Never Smoker  . Smokeless tobacco: Never Used  . Alcohol use No    OB History    Gravida Para Term Preterm AB Living   1         0   SAB TAB Ectopic Multiple Live Births                  Review of Systems  Constitutional: Negative.   HENT: Negative.   Eyes: Negative.   Respiratory: Negative.   Cardiovascular: Negative.   Gastrointestinal: Positive for abdominal pain, nausea and vomiting.  Endocrine: Negative.   Genitourinary: Negative.   Musculoskeletal: Negative.   Allergic/Immunologic: Negative.   Neurological: Negative.   Hematological: Negative.   Psychiatric/Behavioral: Negative.     Allergies  Patient has no known allergies.  Home Medications    LMP 05/24/2016 (Approximate)   Physical Exam  Constitutional: She is oriented to person, place, and time. She appears well-developed and well-nourished.  HENT:  Head: Normocephalic.  Eyes: Pupils are equal, round, and reactive to light.  Neck: Normal range of motion.  Cardiovascular: Normal rate, regular rhythm, normal heart sounds and intact distal pulses.   Pulmonary/Chest: Effort normal and breath sounds normal.  Abdominal: Soft. Bowel sounds are normal.  Genitourinary: Vagina normal and uterus normal.  Musculoskeletal: Normal range of motion.  Neurological: She is alert and  oriented to person, place, and time. She has normal reflexes.  Skin: Skin is warm and dry.  Psychiatric: She has a normal mood and affect. Her behavior is normal. Judgment and thought content normal.    MAU Course  Procedures (including critical care time)  Labs Reviewed  URINALYSIS, ROUTINE W REFLEX MICROSCOPIC   No results found.   1. Preterm uterine contractions in third trimester, antepartum   2. Non-intractable vomiting with nausea, unspecified vomiting type       MDM   G1P0 in with nausea, vomiting x 2 days and contractions despite taking procardia as directed. While interviewing pt she was texting on her phone. FFN done on 4/10 was neg. VSS, SVE cl/post/60/high. No cervical exan noted since prior exams.  FHR pattern reassuring, mild uc's 8-10 min apart.UA shows adequate hydration. Will d/c home with zofran and to continue procardia.

## 2017-01-28 NOTE — Discharge Instructions (Signed)

## 2017-02-03 LAB — OB RESULTS CONSOLE GBS: GBS: NEGATIVE

## 2017-02-20 ENCOUNTER — Inpatient Hospital Stay (HOSPITAL_COMMUNITY)
Admission: AD | Admit: 2017-02-20 | Discharge: 2017-02-20 | Disposition: A | Discharge status: Home / Self Care | Payer: Medicaid Other | Source: Ambulatory Visit | Attending: Obstetrics & Gynecology | Admitting: Obstetrics & Gynecology

## 2017-02-20 ENCOUNTER — Inpatient Hospital Stay (HOSPITAL_COMMUNITY): Payer: Medicaid Other

## 2017-02-20 ENCOUNTER — Encounter (HOSPITAL_COMMUNITY): Payer: Self-pay

## 2017-02-20 DIAGNOSIS — O368130 Decreased fetal movements, third trimester, not applicable or unspecified: Secondary | ICD-10-CM | POA: Diagnosis not present

## 2017-02-20 DIAGNOSIS — O368190 Decreased fetal movements, unspecified trimester, not applicable or unspecified: Secondary | ICD-10-CM

## 2017-02-20 DIAGNOSIS — Z3A37 37 weeks gestation of pregnancy: Secondary | ICD-10-CM | POA: Diagnosis not present

## 2017-02-20 NOTE — MAU Note (Signed)
Urine in lab 

## 2017-02-20 NOTE — MAU Provider Note (Signed)
  History     CSN: [id number]  Arrival date and time: 02/20/17 1543   First Provider Initiated Contact with Patient 02/20/17 1619      Chief Complaint  Patient presents with  . Non-stress Test  . decreased fm   HPI Ms. Natasha Good is a 21 y.o. G1P0 at 5429w4d who presents to MAU today with complaint of decreased fetal movement x 2 weeks. The patient states that she was having contractions earlier as well, but none now. She has had issues with preterm contractions during this pregnancy requiring Procardia, but has not had to use the Procardia in > 1 week. She denies vaginal bleeding, LOF, N/V, vaginal discharge or UTI symptoms.   OB History    Gravida Para Term Preterm AB Living   1         0   SAB TAB Ectopic Multiple Live Births                  Past Medical History:  Diagnosis Date  . Acid reflux   . Anemia   . Anxiety   . Ovarian cyst     Past Surgical History:  Procedure Laterality Date  . WISDOM TOOTH EXTRACTION      Family History  Problem Relation Age of Onset  . Heart disease Father     Social History  Substance Use Topics  . Smoking status: Never Smoker  . Smokeless tobacco: Never Used  . Alcohol use No    Allergies: No Known Allergies  No prescriptions prior to admission.    Review of Systems  Constitutional: Negative for fever.  Gastrointestinal: Negative for abdominal pain, constipation, diarrhea, nausea and vomiting.  Genitourinary: Negative for dysuria, frequency, urgency, vaginal bleeding and vaginal discharge.   Physical Exam   Blood pressure 115/74, pulse 73, temperature 98.4 F (36.9 C), temperature source Oral, resp. rate 16, last menstrual period 05/24/2016, SpO2 99 %.  Physical Exam  Nursing note and vitals reviewed. Constitutional: She is oriented to person, place, and time. She appears well-developed and well-nourished. No distress.  HENT:  Head: Normocephalic and atraumatic.  Cardiovascular: Normal rate.   Respiratory: Effort  normal.  GI: Soft. She exhibits no distension and no mass. There is no tenderness. There is no rebound and no guarding.  Neurological: She is alert and oriented to person, place, and time.  Skin: Skin is warm and dry. No erythema.  Psychiatric: She has a normal mood and affect.   Cervical exam:  Dilation: 1.5 Effacement (%): 40 Cervical Position: Middle Station: -3 Presentation: Vertex Exam by:: Naomie DeanJaneen McClellan, RN   Fetal Monitoring: Baseline: 140 bpm Variability: moderate Accelerations: 15 x 15 (few) Decelerations: none Contractions: irregular  MAU Course  Procedures None   MDM NST today - non-reactive BPP ordered 6/8 today  NST reactive upon return to MAU Reviewed by Dr. Debroah LoopArnold. Ok for discharge at this time. Follow-up in 3 days for repeat BPP. Message sent to [facility] to schedule BPP.  Assessment and Plan  A: SIUP at 6329w4d Decreased fetal movement.   P:  Discharge home Labor precautions and kick counts discussed Patient advised to follow-up with MFM for repeat BPP in 3 days. Message sent to [facility] to schedule.  Patient may return to MAU as needed or if her condition were to change or worsen   Vonzella NippleJulie Jt Brabec, PA-C 02/20/2017, 7:50 PM

## 2017-02-20 NOTE — Progress Notes (Addendum)
[facility].   1700: To U/S via wheel chair for BPP  1745: Back from U/S. 6/8 BPP. Provider made aware.   1750: Monitors placed.  1812: informed pt need to monitor baby for another 30 mins.   1816: turned pt to left lateral side. 1830: Provider at bs updating pt and discussing POC 1835: monitors off. Instructed pt to get dressed and be back to give discharge instructions.   1846: Discharge instructions given with pt understanding. Pt left unit with family via ambulatory.

## 2017-02-20 NOTE — Discharge Instructions (Signed)
Fetal Movement Counts Patient Name: ________________________________________________ Patient Due Date: ____________________ What is a fetal movement count? A fetal movement count is the number of times that you feel your baby move during a certain amount of time. This may also be called a fetal kick count. A fetal movement count is recommended for every pregnant woman. You may be asked to start counting fetal movements as early as week 28 of your pregnancy. Pay attention to when your baby is most active. You may notice your baby's sleep and wake cycles. You may also notice things that make your baby move more. You should do a fetal movement count:  When your baby is normally most active.  At the same time each day. A good time to count movements is while you are resting, after having something to eat and drink. How do I count fetal movements? 1. Find a quiet, comfortable area. Sit, or lie down on your side. 2. Write down the date, the start time and stop time, and the number of movements that you felt between those two times. Take this information with you to your health care visits. 3. For 2 hours, count kicks, flutters, swishes, rolls, and jabs. You should feel at least 10 movements during 2 hours. 4. You may stop counting after you have felt 10 movements. 5. If you do not feel 10 movements in 2 hours, have something to eat and drink. Then, keep resting and counting for 1 hour. If you feel at least 4 movements during that hour, you may stop counting. Contact a health care provider if:  You feel fewer than 4 movements in 2 hours.  Your baby is not moving like he or she usually does. Date: ____________ Start time: ____________ Stop time: ____________ Movements: ____________ Date: ____________ Start time: ____________ Stop time: ____________ Movements: ____________ Date: ____________ Start time: ____________ Stop time: ____________ Movements: ____________ Date: ____________ Start time:  ____________ Stop time: ____________ Movements: ____________ Date: ____________ Start time: ____________ Stop time: ____________ Movements: ____________ Date: ____________ Start time: ____________ Stop time: ____________ Movements: ____________ Date: ____________ Start time: ____________ Stop time: ____________ Movements: ____________ Date: ____________ Start time: ____________ Stop time: ____________ Movements: ____________ Date: ____________ Start time: ____________ Stop time: ____________ Movements: ____________ This information is not intended to replace advice given to you by your health care provider. Make sure you discuss any questions you have with your health care provider. Document Released: 11/02/2006 Document Revised: 06/01/2016 Document Reviewed: 11/12/2015 Elsevier Interactive Patient Education  2017 Elsevier Inc. Braxton Hicks Contractions Contractions of the uterus can occur throughout pregnancy, but they are not always a sign that you are in labor. You may have practice contractions called Braxton Hicks contractions. These false labor contractions are sometimes confused with true labor. What are Braxton Hicks contractions? Braxton Hicks contractions are tightening movements that occur in the muscles of the uterus before labor. Unlike true labor contractions, these contractions do not result in opening (dilation) and thinning of the cervix. Toward the end of pregnancy (32-34 weeks), Braxton Hicks contractions can happen more often and may become stronger. These contractions are sometimes difficult to tell apart from true labor because they can be very uncomfortable. You should not feel embarrassed if you go to the hospital with false labor. Sometimes, the only way to tell if you are in true labor is for your health care provider to look for changes in the cervix. The health care provider will do a physical exam and may monitor your contractions. If you   are not in true labor, the exam  should show that your cervix is not dilating and your water has not broken. If there are no prenatal problems or other health problems associated with your pregnancy, it is completely safe for you to be sent home with false labor. You may continue to have Braxton Hicks contractions until you go into true labor. How can I tell the difference between true labor and false labor?  Differences  False labor  Contractions last 30-70 seconds.: Contractions are usually shorter and not as strong as true labor contractions.  Contractions become very regular.: Contractions are usually irregular.  Discomfort is usually felt in the top of the uterus, and it spreads to the lower abdomen and low back.: Contractions are often felt in the front of the lower abdomen and in the groin.  Contractions do not go away with walking.: Contractions may go away when you walk around or change positions while lying down.  Contractions usually become more intense and increase in frequency.: Contractions get weaker and are shorter-lasting as time goes on.  The cervix dilates and gets thinner.: The cervix usually does not dilate or become thin. Follow these instructions at home:  Take over-the-counter and prescription medicines only as told by your health care provider.  Keep up with your usual exercises and follow other instructions from your health care provider.  Eat and drink lightly if you think you are going into labor.  If Braxton Hicks contractions are making you uncomfortable:  Change your position from lying down or resting to walking, or change from walking to resting.  Sit and rest in a tub of warm water.  Drink enough fluid to keep your urine clear or pale yellow. Dehydration may cause these contractions.  Do slow and deep breathing several times an hour.  Keep all follow-up prenatal visits as told by your health care provider. This is important. Contact a health care provider if:  You have a  fever.  You have continuous pain in your abdomen. Get help right away if:  Your contractions become stronger, more regular, and closer together.  You have fluid leaking or gushing from your vagina.  You pass blood-tinged mucus (bloody show).  You have bleeding from your vagina.  You have low back pain that you never had before.  You feel your baby's head pushing down and causing pelvic pressure.  Your baby is not moving inside you as much as it used to. Summary  Contractions that occur before labor are called Braxton Hicks contractions, false labor, or practice contractions.  Braxton Hicks contractions are usually shorter, weaker, farther apart, and less regular than true labor contractions. True labor contractions usually become progressively stronger and regular and they become more frequent.  Manage discomfort from Braxton Hicks contractions by changing position, resting in a warm bath, drinking plenty of water, or practicing deep breathing. This information is not intended to replace advice given to you by your health care provider. Make sure you discuss any questions you have with your health care provider. Document Released: 10/03/2005 Document Revised: 08/22/2016 Document Reviewed: 08/22/2016 Elsevier Interactive Patient Education  2017 Elsevier Inc.  

## 2017-02-22 ENCOUNTER — Telehealth: Payer: Self-pay

## 2017-02-22 DIAGNOSIS — O0993 Supervision of high risk pregnancy, unspecified, third trimester: Secondary | ICD-10-CM

## 2017-02-22 DIAGNOSIS — Z3A38 38 weeks gestation of pregnancy: Secondary | ICD-10-CM

## 2017-02-22 NOTE — Telephone Encounter (Signed)
Spoke with patient concerning appointment for tomorrow 02/23/2017 arrival time at 11:00 for 12:30 delivery. Patient stated she has another appointment and wanted to know if we could change this appointment to a later time. I advised patient they are working her in and it is very important that she keep this appointment for her safety and baby safety. Patient voice understanding at this time.

## 2017-02-22 NOTE — Telephone Encounter (Signed)
Per Dr.Arnold patient needs to be scheduled for BPP on 02/23/2017. This will scheduled and patient will be contacted.

## 2017-02-23 ENCOUNTER — Ambulatory Visit (HOSPITAL_COMMUNITY)
Admission: RE | Admit: 2017-02-23 | Discharge: 2017-02-23 | Disposition: A | Discharge status: Home / Self Care | Payer: Medicaid Other | Source: Ambulatory Visit | Attending: Medical | Admitting: Medical

## 2017-02-23 ENCOUNTER — Inpatient Hospital Stay (HOSPITAL_COMMUNITY)
Admission: AD | Admit: 2017-02-23 | Discharge: 2017-02-23 | Disposition: A | Discharge status: Home / Self Care | Payer: Medicaid Other | Source: Ambulatory Visit | Attending: Obstetrics and Gynecology | Admitting: Obstetrics and Gynecology

## 2017-02-23 ENCOUNTER — Encounter: Payer: Self-pay | Admitting: Obstetrics and Gynecology

## 2017-02-23 ENCOUNTER — Encounter (HOSPITAL_COMMUNITY): Payer: Self-pay | Admitting: *Deleted

## 2017-02-23 DIAGNOSIS — Z3A38 38 weeks gestation of pregnancy: Secondary | ICD-10-CM | POA: Diagnosis not present

## 2017-02-23 DIAGNOSIS — Z9889 Other specified postprocedural states: Secondary | ICD-10-CM | POA: Diagnosis not present

## 2017-02-23 DIAGNOSIS — O99343 Other mental disorders complicating pregnancy, third trimester: Secondary | ICD-10-CM | POA: Insufficient documentation

## 2017-02-23 DIAGNOSIS — O283 Abnormal ultrasonic finding on antenatal screening of mother: Secondary | ICD-10-CM | POA: Insufficient documentation

## 2017-02-23 DIAGNOSIS — K219 Gastro-esophageal reflux disease without esophagitis: Secondary | ICD-10-CM | POA: Diagnosis not present

## 2017-02-23 DIAGNOSIS — Z6791 Unspecified blood type, Rh negative: Secondary | ICD-10-CM

## 2017-02-23 DIAGNOSIS — N83209 Unspecified ovarian cyst, unspecified side: Secondary | ICD-10-CM | POA: Insufficient documentation

## 2017-02-23 DIAGNOSIS — O0993 Supervision of high risk pregnancy, unspecified, third trimester: Secondary | ICD-10-CM | POA: Insufficient documentation

## 2017-02-23 DIAGNOSIS — F419 Anxiety disorder, unspecified: Secondary | ICD-10-CM | POA: Insufficient documentation

## 2017-02-23 DIAGNOSIS — O99613 Diseases of the digestive system complicating pregnancy, third trimester: Secondary | ICD-10-CM | POA: Insufficient documentation

## 2017-02-23 DIAGNOSIS — O3483 Maternal care for other abnormalities of pelvic organs, third trimester: Secondary | ICD-10-CM | POA: Insufficient documentation

## 2017-02-23 DIAGNOSIS — O368330 Maternal care for abnormalities of the fetal heart rate or rhythm, third trimester, not applicable or unspecified: Secondary | ICD-10-CM | POA: Insufficient documentation

## 2017-02-23 DIAGNOSIS — Z8249 Family history of ischemic heart disease and other diseases of the circulatory system: Secondary | ICD-10-CM | POA: Insufficient documentation

## 2017-02-23 DIAGNOSIS — O368190 Decreased fetal movements, unspecified trimester, not applicable or unspecified: Secondary | ICD-10-CM

## 2017-02-23 DIAGNOSIS — O368390 Maternal care for abnormalities of the fetal heart rate or rhythm, unspecified trimester, not applicable or unspecified: Secondary | ICD-10-CM | POA: Insufficient documentation

## 2017-02-23 DIAGNOSIS — O368130 Decreased fetal movements, third trimester, not applicable or unspecified: Secondary | ICD-10-CM | POA: Insufficient documentation

## 2017-02-23 DIAGNOSIS — O26899 Other specified pregnancy related conditions, unspecified trimester: Secondary | ICD-10-CM

## 2017-02-23 NOTE — MAU Note (Signed)
Pt sent over from MFM for 6/8 BPP. Needs prolonged monitoring.

## 2017-02-23 NOTE — MAU Provider Note (Signed)
Maternity Admission Unit H&P  02/23/2017 - 12:56 PM Primary [facility]: [facility]  Chief Complaint: 6/8 BPP in MFM  History of Present Illness  20 y.o. G1 @ 6567w0d, with the above CC. Pregnancy complicated by: history of fetal arrhythmia, Rh neg, BMI 24  5/7: [facility], NR NST with VAS so sent to MAU. NR NST in MAU and had 6/8 BPP (-2 breathing) and fetal arrhythmia noted and ? PACs. Plan was to come back today for repeat BPP on 5/10  5/10: 6/8 (-2 for movement), no arrhythmias seen, cephalic  Patient states she's had weeks of decreased FM and that it hasn't felt the same since about 28wks. She states that since last being in the MAU she's had ?decreased FM.   She denies any labor s/s.   Review of Systems:  as noted in the History of Present Illness.   PMHx:  Past Medical History:  Diagnosis Date  . Acid reflux   . Anemia   . Anxiety   . Ovarian cyst    PSHx:  Past Surgical History:  Procedure Laterality Date  . WISDOM TOOTH EXTRACTION     Medications:  Prescriptions Prior to Admission  Medication Sig Dispense Refill Last Dose  . acetaminophen (TYLENOL) 325 MG tablet Take 650 mg by mouth every 6 (six) hours as needed for mild pain.   02/19/2017 at Unknown time  . Prenatal Vit-Fe Fumarate-FA (PRENATAL MULTIVITAMIN) TABS tablet Take 1 tablet by mouth daily at 12 noon.   02/19/2017 at Unknown time     Allergies: has No Known Allergies. OBHx:  OB History  Gravida Para Term Preterm AB Living  1         0  SAB TAB Ectopic Multiple Live Births               # Outcome Date GA Lbr Len/2nd Weight Sex Delivery Anes PTL Lv  1 Current             .             FHx:  Family History  Problem Relation Age of Onset  . Heart disease Father    Soc Hx:  Social History   Social History  . Marital status: Single    Spouse name: n/a  . Number of children: 0  . Years of education: N/A   Occupational History  . student     [employer]   Social  History Main Topics  . Smoking status: Never Smoker  . Smokeless tobacco: Never Used  . Alcohol use No  . Drug use: No  . Sexual activity: Yes    Birth control/ protection: None     Comment: last sex Nov 09, 2016   Other Topics Concern  . Not on file   Social History Narrative   Lives with her mother and younger sister.  [place] in [place].    Objective    Current Vital Signs 24h Vital Sign Ranges  T 98 F (36.7 C) Temp  Avg: 98 F (36.7 C)  Min: 98 F (36.7 C)  Max: 98 F (36.7 C)  BP 116/71 BP  Min: 116/71  Max: 116/71  HR 72 Pulse  Avg: 72  Min: 72  Max: 72  RR 18 Resp  Avg: 18  Min: 18  Max: 18  SaO2     No Data Recorded       24 Hour I/O Current Shift I/O  Time Ins Outs No intake/output data recorded. No intake/output data recorded.   EFM: 140 baseline, +accels, no decels, mod variability  Toco: +irritability, q5-36m  General: Well nourished, well developed female in no acute distress.  Skin:  Warm and dry.  Respiratory:   Normal respiratory effort Abdomen: gravid, soft, nttp, FH 37 Neuro/Psych:  Normal mood and affect.   SVE: per RN, unchanged at 1-2cm (from prior MAU visit)  Labs  No new labs  Radiology Fetal Evaluation  Num Of Fetuses:     1  Fetal Heart         144  Rate(bpm):  Cardiac Activity:   Observed  Presentation:       Cephalic  Amniotic Fluid  AFI FV:      Subjectively within normal limits  AFI Sum(cm)     %Tile       Largest Pocket(cm)  15.03           58          4.15  RUQ(cm)       RLQ(cm)       LUQ(cm)        LLQ(cm)  4             4.14          4.15           2.74 ---------------------------------------------------------------------- Biophysical Evaluation  Amniotic F.V:   Within normal limits       F. Tone:        Observed  F. Movement:    Not Observed               Score:          6/8  F. Breathing:   Observed ---------------------------------------------------------------------- Gestational Age  Best:           38w 0d    Det. By:   Previous Ultrasound      EDD:   03/09/17                                      (07/19/16) ---------------------------------------------------------------------- Impression  Single living intrauterine pregnancy at [redacted]w[redacted]d.  Normal amniotic fluid volume.  BPP 6/8 for absent fetal movement. ---------------------------------------------------------------------- Recommendations  On call MD notified. Patient referred to MAU for further  evaluation. ----------------------------------------------------------------------                   Darlyn Read, MD Electronically Signed Final Report   02/23/2017 12:32 pm  Assessment & Plan   20 y.o. G1P0 @ [redacted]w[redacted]d with 8/10 BPP. Pt doing well Difficult to tell in talking to the patient if she is sensing actual decreased FM vs decreased intensity of movements. FKC precautions reviewed with her and when to present to MAU. She has f/u with [facility] on 5/14 and will ask them to do an NST on her to ensure fetal status is still well and for maternal reassurance. MFM able to add work in growth u/s for 5/17.   Patient amenable to plan.   Cornelia Copa MD Attending [facility])

## 2017-02-23 NOTE — Discharge Instructions (Signed)
Fetal Movement Counts  Patient Name: ________________________________________________ Patient Due Date: ____________________  What is a fetal movement count?  A fetal movement count is the number of times that you feel your baby move during a certain amount of time. This may also be called a fetal kick count. A fetal movement count is recommended for every pregnant woman. You may be asked to start counting fetal movements as early as week 28 of your pregnancy.  Pay attention to when your baby is most active. You may notice your baby's sleep and wake cycles. You may also notice things that make your baby move more. You should do a fetal movement count:  · When your baby is normally most active.  · At the same time each day.    A good time to count movements is while you are resting, after having something to eat and drink.  How do I count fetal movements?  1. Find a quiet, comfortable area. Sit, or lie down on your side.  2. Write down the date, the start time and stop time, and the number of movements that you felt between those two times. Take this information with you to your health care visits.  3. For 2 hours, count kicks, flutters, swishes, rolls, and jabs. You should feel at least 10 movements during 2 hours.  4. You may stop counting after you have felt 10 movements.  5. If you do not feel 10 movements in 2 hours, have something to eat and drink. Then, keep resting and counting for 1 hour. If you feel at least 4 movements during that hour, you may stop counting.  Contact a health care provider if:  · You feel fewer than 4 movements in 2 hours.  · Your baby is not moving like he or she usually does.  Date: ____________ Start time: ____________ Stop time: ____________ Movements: ____________  Date: ____________ Start time: ____________ Stop time: ____________ Movements: ____________  Date: ____________ Start time: ____________ Stop time: ____________ Movements: ____________  Date: ____________ Start time:  ____________ Stop time: ____________ Movements: ____________  Date: ____________ Start time: ____________ Stop time: ____________ Movements: ____________  Date: ____________ Start time: ____________ Stop time: ____________ Movements: ____________  Date: ____________ Start time: ____________ Stop time: ____________ Movements: ____________  Date: ____________ Start time: ____________ Stop time: ____________ Movements: ____________  Date: ____________ Start time: ____________ Stop time: ____________ Movements: ____________  This information is not intended to replace advice given to you by your health care provider. Make sure you discuss any questions you have with your health care provider.  Document Released: 11/02/2006 Document Revised: 06/01/2016 Document Reviewed: 11/12/2015  Elsevier Interactive Patient Education © 2017 Elsevier Inc.

## 2017-02-24 ENCOUNTER — Ambulatory Visit (HOSPITAL_COMMUNITY): Payer: Medicaid Other

## 2017-02-25 ENCOUNTER — Inpatient Hospital Stay (EMERGENCY_DEPARTMENT_HOSPITAL)
Admission: AD | Admit: 2017-02-25 | Discharge: 2017-02-25 | Disposition: A | Discharge status: Home / Self Care | Payer: Medicaid Other | Source: Ambulatory Visit | Attending: Obstetrics & Gynecology | Admitting: Obstetrics & Gynecology

## 2017-02-25 DIAGNOSIS — N760 Acute vaginitis: Secondary | ICD-10-CM

## 2017-02-25 DIAGNOSIS — B9689 Other specified bacterial agents as the cause of diseases classified elsewhere: Secondary | ICD-10-CM | POA: Diagnosis not present

## 2017-02-25 DIAGNOSIS — O9989 Other specified diseases and conditions complicating pregnancy, childbirth and the puerperium: Secondary | ICD-10-CM

## 2017-02-25 LAB — WET PREP, GENITAL
SPERM: NONE SEEN
TRICH WET PREP: NONE SEEN
Yeast Wet Prep HPF POC: NONE SEEN

## 2017-02-25 LAB — POCT FERN TEST: POCT FERN TEST: NEGATIVE

## 2017-02-25 MED ORDER — METRONIDAZOLE 500 MG PO TABS: 500.0000 mg | ORAL_TABLET | Freq: Two times a day (BID) | ORAL | 0 refills | Status: DC

## 2017-02-25 NOTE — MAU Note (Signed)
Pt reports she has had leaking of clear fluid since earlier this morning. Reports some mild contractions. Fetal movement less than usual.

## 2017-02-25 NOTE — MAU Provider Note (Signed)
None     Chief Complaint:  Labor Eval   Natasha Good Good  21 y.o. G1P0 at 9726w2d presents complaining of Labor Eval . She says she has "leaked" a few times and began having "cramps" on the way up here. Was 1.5cms dilated last week per pt  Obstetrical/Gynecological History: OB History    Gravida Para Term Preterm AB Living   1         0   SAB TAB Ectopic Multiple Live Births                 Past Medical History: Past Medical History:  Diagnosis Date  . Acid reflux   . Anemia   . Anxiety   . Ovarian cyst     Past Surgical History: Past Surgical History:  Procedure Laterality Date  . WISDOM TOOTH EXTRACTION      Family History: Family History  Problem Relation Age of Onset  . Heart disease Father     Social History: Social History  Substance Use Topics  . Smoking status: Never Smoker  . Smokeless tobacco: Never Used  . Alcohol use No    Allergies: No Known Allergies  Meds:  Prescriptions Prior to Admission  Medication Sig Dispense Refill Last Dose  . acetaminophen (TYLENOL) 325 MG tablet Take 650 mg by mouth every 6 (six) hours as needed for mild pain.   Past Week at Unknown time  . Prenatal Vit-Fe Fumarate-FA (PRENATAL MULTIVITAMIN) TABS tablet Take 1 tablet by mouth daily at 12 noon.   02/25/2017 at Unknown time    Review of Systems   Constitutional: Negative for fever and chills Eyes: Negative for visual disturbances Respiratory: Negative for shortness of breath, dyspnea Cardiovascular: Negative for chest pain or palpitations  Gastrointestinal: Negative for vomiting, diarrhea and constipation Genitourinary: Negative for dysuria and urgency Musculoskeletal: Negative for back pain, joint pain, myalgias.  Normal ROM  Neurological: Negative for dizziness and headaches    Physical Exam  Blood pressure 116/74, pulse 74, temperature 98.2 F (36.8 C), temperature source Oral, resp. rate 18, height 5\' 5"  (1.651 m), weight 65.3 kg (144 lb 0.6 oz), last  menstrual period 05/24/2016. GENERAL: Well-developed, well-nourished female in no acute distress.  LUNGS: Clear to auscultation bilaterally.  HEART: Regular rate and rhythm. ABDOMEN: Soft, nontender, nondistended, gravid.  EXTREMITIES: Nontender, no edema, 2+ distal pulses. DTR's 2+ PELVIC:  SSE:  White frothy dc with amine odor; no pooling, neg fern/valsalva.   CERVICAL EXAM: Dilatation 1.5cm   Effacement 70%   Station -1   Presentation: cephalic FHT:  Baseline rate 145 bpm   Variability moderate  Accelerations present   Decelerations none Contractions: some irregular and mild ctx on EFM, felt as a "cramp" and "tightening" by pt  Labs: Results for orders placed or performed during the hospital encounter of 02/25/17 (from the past 24 hour(s))  Wet prep, genital   Collection Time: 02/25/17  3:20 PM  Result Value Ref Range   Yeast Wet Prep HPF POC NONE SEEN NONE SEEN   Trich, Wet Prep NONE SEEN NONE SEEN   Clue Cells Wet Prep HPF POC PRESENT (A) NONE SEEN   WBC, Wet Prep HPF POC MANY (A) NONE SEEN   Sperm NONE SEEN      Assessment: Natasha Good  21 y.o. G1P0 at 8626w2d presents with BV, mild ctx.  Plan: rx flagyl Labor precautions discussed  CRESENZO-DISHMAN,Zadaya Cuadra 5/12/20183:58 PM

## 2017-02-25 NOTE — Discharge Instructions (Signed)

## 2017-02-26 ENCOUNTER — Inpatient Hospital Stay (HOSPITAL_COMMUNITY): Payer: Medicaid Other | Admitting: Anesthesiology

## 2017-02-26 ENCOUNTER — Encounter (HOSPITAL_COMMUNITY): Payer: Self-pay | Admitting: *Deleted

## 2017-02-26 ENCOUNTER — Inpatient Hospital Stay (HOSPITAL_COMMUNITY)
Admission: AD | Admit: 2017-02-26 | Discharge: 2017-03-01 | DRG: 775 | Disposition: A | Discharge status: Home / Self Care | Payer: Medicaid Other | Source: Ambulatory Visit | Attending: Obstetrics & Gynecology | Admitting: Obstetrics & Gynecology

## 2017-02-26 DIAGNOSIS — Z8249 Family history of ischemic heart disease and other diseases of the circulatory system: Secondary | ICD-10-CM

## 2017-02-26 DIAGNOSIS — Z6791 Unspecified blood type, Rh negative: Secondary | ICD-10-CM

## 2017-02-26 DIAGNOSIS — Z3493 Encounter for supervision of normal pregnancy, unspecified, third trimester: Secondary | ICD-10-CM | POA: Diagnosis present

## 2017-02-26 DIAGNOSIS — O26893 Other specified pregnancy related conditions, third trimester: Secondary | ICD-10-CM | POA: Diagnosis present

## 2017-02-26 DIAGNOSIS — Z3A38 38 weeks gestation of pregnancy: Secondary | ICD-10-CM | POA: Diagnosis not present

## 2017-02-26 LAB — CBC
HEMATOCRIT: 36.9 % (ref 36.0–46.0)
Hemoglobin: 12.2 g/dL (ref 12.0–15.0)
MCH: 30.3 pg (ref 26.0–34.0)
MCHC: 33.1 g/dL (ref 30.0–36.0)
MCV: 91.8 fL (ref 78.0–100.0)
Platelets: 193 10*3/uL (ref 150–400)
RBC: 4.02 MIL/uL (ref 3.87–5.11)
RDW: 14.1 % (ref 11.5–15.5)
WBC: 10.6 10*3/uL — AB (ref 4.0–10.5)

## 2017-02-26 MED ORDER — LIDOCAINE HCL (PF) 1 % IJ SOLN
30.0000 mL | INTRAMUSCULAR | Status: DC | PRN
  Filled 2017-02-26: qty 30

## 2017-02-26 MED ORDER — SOD CITRATE-CITRIC ACID 500-334 MG/5ML PO SOLN: 30.0000 mL | ORAL | Status: DC | PRN

## 2017-02-26 MED ORDER — LACTATED RINGERS IV SOLN
500.0000 mL | Freq: Once | INTRAVENOUS | Status: AC
  Administered 2017-02-26: 500 mL via INTRAVENOUS

## 2017-02-26 MED ORDER — EPHEDRINE 5 MG/ML INJ
10.0000 mg | INTRAVENOUS | Status: DC | PRN
  Filled 2017-02-26: qty 2

## 2017-02-26 MED ORDER — OXYCODONE-ACETAMINOPHEN 5-325 MG PO TABS: 2.0000 | ORAL_TABLET | ORAL | Status: DC | PRN

## 2017-02-26 MED ORDER — PHENYLEPHRINE 40 MCG/ML (10ML) SYRINGE FOR IV PUSH (FOR BLOOD PRESSURE SUPPORT)
80.0000 ug | PREFILLED_SYRINGE | INTRAVENOUS | Status: DC | PRN
  Filled 2017-02-26: qty 5

## 2017-02-26 MED ORDER — OXYCODONE-ACETAMINOPHEN 5-325 MG PO TABS: 1.0000 | ORAL_TABLET | ORAL | Status: DC | PRN

## 2017-02-26 MED ORDER — LACTATED RINGERS IV SOLN
INTRAVENOUS | Status: DC
  Administered 2017-02-26 (×2): via INTRAVENOUS

## 2017-02-26 MED ORDER — LACTATED RINGERS IV SOLN: 500.0000 mL | INTRAVENOUS | Status: DC | PRN

## 2017-02-26 MED ORDER — FENTANYL 2.5 MCG/ML BUPIVACAINE 1/10 % EPIDURAL INFUSION (WH - ANES)
14.0000 mL/h | INTRAMUSCULAR | Status: DC | PRN
  Administered 2017-02-26: 14 mL/h via EPIDURAL
  Filled 2017-02-26 (×2): qty 100

## 2017-02-26 MED ORDER — PHENYLEPHRINE 40 MCG/ML (10ML) SYRINGE FOR IV PUSH (FOR BLOOD PRESSURE SUPPORT)
80.0000 ug | PREFILLED_SYRINGE | INTRAVENOUS | Status: DC | PRN
  Filled 2017-02-26: qty 10
  Filled 2017-02-26: qty 5

## 2017-02-26 MED ORDER — OXYTOCIN BOLUS FROM INFUSION
500.0000 mL | Freq: Once | INTRAVENOUS | Status: AC
  Administered 2017-02-27: 500 mL via INTRAVENOUS

## 2017-02-26 MED ORDER — ACETAMINOPHEN 325 MG PO TABS: 650.0000 mg | ORAL_TABLET | ORAL | Status: DC | PRN

## 2017-02-26 MED ORDER — LIDOCAINE HCL (PF) 1 % IJ SOLN
INTRAMUSCULAR | Status: DC | PRN
  Administered 2017-02-26 (×2): 5 mL via EPIDURAL

## 2017-02-26 MED ORDER — OXYTOCIN 40 UNITS IN LACTATED RINGERS INFUSION - SIMPLE MED
2.5000 [IU]/h | INTRAVENOUS | Status: DC
  Administered 2017-02-27: 2.5 [IU]/h via INTRAVENOUS
  Filled 2017-02-26: qty 1000

## 2017-02-26 MED ORDER — ONDANSETRON HCL 4 MG/2ML IJ SOLN: 4.0000 mg | Freq: Four times a day (QID) | INTRAMUSCULAR | Status: DC | PRN

## 2017-02-26 MED ORDER — DIPHENHYDRAMINE HCL 50 MG/ML IJ SOLN: 12.5000 mg | INTRAMUSCULAR | Status: DC | PRN

## 2017-02-26 MED ORDER — FLEET ENEMA 7-19 GM/118ML RE ENEM: 1.0000 | ENEMA | RECTAL | Status: DC | PRN

## 2017-02-26 NOTE — Anesthesia Preprocedure Evaluation (Signed)
Anesthesia Evaluation  Patient identified by MRN, date of birth, ID band Patient awake    Reviewed: Allergy & Precautions, NPO status , Patient's Chart, lab work & pertinent test results  Airway Mallampati: II  TM Distance: >3 FB Neck ROM: Full    Dental no notable dental hx. (+) Dental Advisory Given   Pulmonary neg pulmonary ROS,    Pulmonary exam normal        Cardiovascular negative cardio ROS Normal cardiovascular exam     Neuro/Psych negative neurological ROS  negative psych ROS   GI/Hepatic Neg liver ROS, GERD  ,  Endo/Other  negative endocrine ROS  Renal/GU negative Renal ROS  negative genitourinary   Musculoskeletal negative musculoskeletal ROS (+)   Abdominal   Peds negative pediatric ROS (+)  Hematology negative hematology ROS (+)   Anesthesia Other Findings   Reproductive/Obstetrics (+) Pregnancy                             Anesthesia Physical Anesthesia Plan  ASA: II  Anesthesia Plan: Epidural   Post-op Pain Management:    Induction:   Airway Management Planned: Natural Airway  Additional Equipment:   Intra-op Plan:   Post-operative Plan:   Informed Consent: I have reviewed the patients History and Physical, chart, labs and discussed the procedure including the risks, benefits and alternatives for the proposed anesthesia with the patient or authorized representative who has indicated his/her understanding and acceptance.   Dental advisory given  Plan Discussed with: Anesthesiologist  Anesthesia Plan Comments:         Anesthesia Quick Evaluation

## 2017-02-26 NOTE — Anesthesia Procedure Notes (Signed)
Epidural Patient location during procedure: OB Start time: 02/26/2017 11:01 PM End time: 02/26/2017 11:13 PM  Staffing Anesthesiologist: Heather RobertsSINGER, Manual Navarra Performed: anesthesiologist   Preanesthetic Checklist Completed: patient identified, site marked, pre-op evaluation, timeout performed, IV checked, risks and benefits discussed and monitors and equipment checked  Epidural Patient position: sitting Prep: DuraPrep Patient monitoring: heart rate, cardiac monitor, continuous pulse ox and blood pressure Approach: midline Location: L2-L3 Injection technique: LOR saline  Needle:  Needle type: Tuohy  Needle gauge: 17 G Needle length: 9 cm Needle insertion depth: 5 cm Catheter size: 20 Guage Catheter at skin depth: 10 cm Test dose: negative and Other  Assessment Events: blood not aspirated, injection not painful, no injection resistance and negative IV test  Additional Notes Informed consent obtained prior to proceeding including risk of failure, 1% risk of PDPH, risk of minor discomfort and bruising.  Discussed rare but serious complications including epidural abscess, permanent nerve injury, epidural hematoma.  Discussed alternatives to epidural analgesia and patient desires to proceed.  Timeout performed pre-procedure verifying patient name, procedure, and platelet count.  Patient tolerated procedure well.

## 2017-02-26 NOTE — MAU Note (Signed)
Pt here with regular contractions since about 3pm. Denies any leaking but having some spotting today. Reports positive fetal movement. GBS neg.

## 2017-02-27 ENCOUNTER — Encounter (HOSPITAL_COMMUNITY): Payer: Self-pay

## 2017-02-27 DIAGNOSIS — Z3A38 38 weeks gestation of pregnancy: Secondary | ICD-10-CM

## 2017-02-27 LAB — RPR: RPR: NONREACTIVE

## 2017-02-27 MED ORDER — ONDANSETRON HCL 4 MG PO TABS: 4.0000 mg | ORAL_TABLET | ORAL | Status: DC | PRN

## 2017-02-27 MED ORDER — SENNOSIDES-DOCUSATE SODIUM 8.6-50 MG PO TABS
2.0000 | ORAL_TABLET | ORAL | Status: DC
  Administered 2017-02-27 – 2017-02-28 (×2): 2 via ORAL
  Filled 2017-02-27 (×2): qty 2

## 2017-02-27 MED ORDER — SODIUM CHLORIDE 0.9 % IV SOLN: 250.0000 mL | INTRAVENOUS | Status: DC | PRN

## 2017-02-27 MED ORDER — TETANUS-DIPHTH-ACELL PERTUSSIS 5-2.5-18.5 LF-MCG/0.5 IM SUSP: 0.5000 mL | Freq: Once | INTRAMUSCULAR | Status: DC

## 2017-02-27 MED ORDER — ONDANSETRON HCL 4 MG/2ML IJ SOLN: 4.0000 mg | INTRAMUSCULAR | Status: DC | PRN

## 2017-02-27 MED ORDER — ZOLPIDEM TARTRATE 5 MG PO TABS: 5.0000 mg | ORAL_TABLET | Freq: Every evening | ORAL | Status: DC | PRN

## 2017-02-27 MED ORDER — ACETAMINOPHEN 325 MG PO TABS
650.0000 mg | ORAL_TABLET | ORAL | Status: DC | PRN
  Administered 2017-02-27: 325 mg via ORAL
  Administered 2017-02-27 – 2017-03-01 (×4): 650 mg via ORAL
  Filled 2017-02-27 (×4): qty 2

## 2017-02-27 MED ORDER — PRENATAL MULTIVITAMIN CH
1.0000 | ORAL_TABLET | Freq: Every day | ORAL | Status: DC
  Administered 2017-02-28 – 2017-03-01 (×2): 1 via ORAL
  Filled 2017-02-27 (×2): qty 1

## 2017-02-27 MED ORDER — IBUPROFEN 600 MG PO TABS
600.0000 mg | ORAL_TABLET | Freq: Four times a day (QID) | ORAL | Status: DC
  Administered 2017-02-27 – 2017-03-01 (×9): 600 mg via ORAL
  Filled 2017-02-27 (×9): qty 1

## 2017-02-27 MED ORDER — SIMETHICONE 80 MG PO CHEW
80.0000 mg | CHEWABLE_TABLET | ORAL | Status: DC | PRN
Start: 2017-02-27 — End: 2017-03-01

## 2017-02-27 MED ORDER — DIBUCAINE 1 % RE OINT: 1.0000 "application " | TOPICAL_OINTMENT | RECTAL | Status: DC | PRN

## 2017-02-27 MED ORDER — OXYTOCIN 40 UNITS IN LACTATED RINGERS INFUSION - SIMPLE MED: 2.5000 [IU]/h | INTRAVENOUS | Status: DC | PRN

## 2017-02-27 MED ORDER — DIPHENHYDRAMINE HCL 25 MG PO CAPS: 25.0000 mg | ORAL_CAPSULE | Freq: Four times a day (QID) | ORAL | Status: DC | PRN

## 2017-02-27 MED ORDER — COCONUT OIL OIL
1.0000 "application " | TOPICAL_OIL | Status: DC | PRN
  Administered 2017-03-01: 1 via TOPICAL
  Filled 2017-02-27: qty 120

## 2017-02-27 MED ORDER — BENZOCAINE-MENTHOL 20-0.5 % EX AERO
1.0000 "application " | INHALATION_SPRAY | CUTANEOUS | Status: DC | PRN
  Administered 2017-02-27: 1 via TOPICAL
  Filled 2017-02-27 (×2): qty 56

## 2017-02-27 MED ORDER — SODIUM CHLORIDE 0.9% FLUSH: 3.0000 mL | Freq: Two times a day (BID) | INTRAVENOUS | Status: DC

## 2017-02-27 MED ORDER — SODIUM CHLORIDE 0.9% FLUSH: 3.0000 mL | INTRAVENOUS | Status: DC | PRN

## 2017-02-27 MED ORDER — WITCH HAZEL-GLYCERIN EX PADS: 1.0000 "application " | MEDICATED_PAD | CUTANEOUS | Status: DC | PRN

## 2017-02-27 NOTE — Anesthesia Postprocedure Evaluation (Signed)
Anesthesia Post Note  Patient: [name]  Procedure(s) Performed: * No procedures listed *  Patient location during evaluation: Mother Baby Anesthesia Type: Epidural Level of consciousness: awake and alert and oriented Pain management: pain level controlled Vital Signs Assessment: post-procedure vital signs reviewed and stable Respiratory status: spontaneous breathing and nonlabored ventilation Cardiovascular status: stable Postop Assessment: no headache, patient able to bend at knees, no backache, no signs of nausea or vomiting, epidural receding and adequate PO intake Anesthetic complications: no        Last Vitals:  Vitals:   02/27/17 0923 02/27/17 1322  BP: 106/63 110/71  Pulse: 66 63  Resp: 17 17  Temp: 37.2 C 36.7 C    Last Pain:  Vitals:   02/27/17 1322  TempSrc: Oral  PainSc:    Pain Goal:                 Natasha O'LakesMalinova,Natasha Good

## 2017-02-27 NOTE — Progress Notes (Signed)
Patient ID: Natasha Good, female   DOB: 02/24/1996, 21 y.o.   MRN: [id number]  S: Patient seen & examined for progress of labor. Patient comfortable with epidural now. States she felt water per vagina.    O:  Vitals:   02/27/17 0001 02/27/17 0031 02/27/17 0101 02/27/17 0131  BP: 106/88 111/67 111/76 117/71  Pulse: 77 72 71 65  Resp: 18 18 18 18   Temp:    98.6 F (37 C)  TempSrc:    Oral  SpO2:      Weight:      Height:        Dilation: 6 Effacement (%): 100 Cervical Position: Middle Station: 0 Presentation: Vertex Exam by:: Dr Omer JackMumaw  +SROM, clear fluid.  FHT: 140bpm, mod var, +accels, no decels TOCO: q2-333min   A/P: SROM, clear fluid Continue expectant management Anticipate SVD

## 2017-02-27 NOTE — H&P (Signed)
LABOR AND DELIVERY ADMISSION HISTORY AND PHYSICAL NOTE  Natasha Good is a 21 y.o. female G1P0 with IUP at [redacted]w[redacted]d presenting for SOL.   She reports positive fetal movement. She denies leakage of fluid or vaginal bleeding.  Prenatal History/Complications:  None  Past Medical History: Past Medical History:  Diagnosis Date  . Acid reflux   . Anemia   . Anxiety   . Ovarian cyst     Past Surgical History: Past Surgical History:  Procedure Laterality Date  . WISDOM TOOTH EXTRACTION      Obstetrical History: OB History    Gravida Para Term Preterm AB Living   1         0   SAB TAB Ectopic Multiple Live Births                  Social History: Social History   Social History  . Marital status: Single    Spouse name: n/a  . Number of children: 0  . Years of education: N/A   Occupational History  . student     [employer]   Social History Main Topics  . Smoking status: Never Smoker  . Smokeless tobacco: Never Used  . Alcohol use No  . Drug use: No  . Sexual activity: Yes    Birth control/ protection: None     Comment: last sex Nov 09, 2016   Other Topics Concern  . None   Social History Narrative   Lives with her mother and younger sister.  Her father lives in [place].    Family History: Family History  Problem Relation Age of Onset  . Heart disease Father     Allergies: No Known Allergies  Prescriptions Prior to Admission  Medication Sig Dispense Refill Last Dose  . acetaminophen (TYLENOL) 325 MG tablet Take 650 mg by mouth every 6 (six) hours as needed for mild pain.   Past Week at Unknown time  . metroNIDAZOLE (FLAGYL) 500 MG tablet Take 1 tablet (500 mg total) by mouth 2 (two) times daily. 14 tablet 0   . Prenatal Vit-Fe Fumarate-FA (PRENATAL MULTIVITAMIN) TABS tablet Take 1 tablet by mouth daily at 12 noon.   02/25/2017 at Unknown time     Review of Systems   All systems reviewed and negative except as stated in HPI  Physical  Exam Blood pressure 106/88, pulse 77, temperature 98.3 F (36.8 C), temperature source Oral, resp. rate 18, height 5\' 5"  (1.651 m), weight 144 lb (65.3 kg), last menstrual period 05/24/2016, SpO2 100 %. General appearance: alert, cooperative, appears stated age and no distress Lungs: clear to auscultation bilaterally Heart: regular rate and rhythm Abdomen: soft, non-tender; bowel sounds normal Extremities: No calf swelling or tenderness Presentation: cephalic Fetal monitoring: 140  Bpm, mod var, +accels, no decels Uterine activity: q2-52min Dilation: 4 Effacement (%): 100 Station: 0 Exam by:: J.Follmer,RNC   Prenatal labs: ABO, Rh: --/--/A NEG (05/13 2225) Antibody: POS (05/13 2225) Rubella: !Error! IMMUNE RPR: Nonreactive (03/01 0000)  HBsAg: Negative (10/26 0000)  HIV: Non Reactive (09/19 1128)  GBS: Negative (04/20 0000)  1 hr Glucola: 95 Genetic screening:  Normal Anatomy US: Normal  Prenatal Transfer Tool  Maternal Diabetes: No Genetic Screening: Normal Maternal Ultrasounds/Referrals: Normal Fetal Ultrasounds or other Referrals:  None Maternal Substance Abuse:  No Significant Maternal Medications:  None Significant Maternal Lab Results: Lab values include: Group B Strep negative, Rh negative  Results for orders placed or performed during the hospital encounter of  02/26/17 (from the past 24 hour(s))  CBC   Collection Time: 02/26/17 10:25 PM  Result Value Ref Range   WBC 10.6 (H) 4.0 - 10.5 K/uL   RBC 4.02 3.87 - 5.11 MIL/uL   Hemoglobin 12.2 12.0 - 15.0 g/dL   HCT 16.136.9 09.636.0 - 04.546.0 %   MCV 91.8 78.0 - 100.0 fL   MCH 30.3 26.0 - 34.0 pg   MCHC 33.1 30.0 - 36.0 g/dL   RDW 40.914.1 81.111.5 - 91.415.5 %   Platelets 193 150 - 400 K/uL  Type and screen [facility]   Collection Time: 02/26/17 10:25 PM  Result Value Ref Range   ABO/RH(D) A NEG    Antibody Screen POS    Sample Expiration 03/01/2017    Antibody Identification PENDING    DAT, IgG NEG      Patient Active Problem List   Diagnosis Date Noted  . Indication for care in labor or delivery 02/26/2017  . Abnormal fetal ultrasound 02/23/2017  . Fetal arrhythmia affecting pregnancy, antepartum 02/23/2017  . Rh negative state in antepartum period 02/23/2017    Assessment: Natasha Good is a 21 y.o. G1P0 at 4318w4d here for SOL.  #Labor:SOL, will augment as needed. #Pain: Epidural #FWB: Cat I #ID:  GBS Neg; Rh NEG, received rhogam at 28 weeks, will give postpartum if needed #MOF: Breast #MOC:Depo #Circ:  N/A - girl  Jen MowElizabeth Addaline Peplinski, DO OB Fellow [facility] 02/27/2017, 1:20 AM

## 2017-02-27 NOTE — Lactation Note (Signed)
This note was copied from a baby's chart. Lactation Consultation Note P1 mom states her goal is to breastfeed as long as infant is latching and will BF.    Dad holding infant in blankets.  LC reviewed BF basics, feeding 8-12 times in 24 hours period, feeding on demand, cues to look for, and STS.  [facility] hand expression.  Several drops of colostrum easily expressed with one compression.  Mom was able to demonstrate as well.  Dad was receptive to teaching.  LC showed mom book and turned to hand expression page for mom's reference.  LC encouraged dad to unwrap infant and infant began cueing.  LC assisted mom in placing infant in football hold and latching infant.  Baby latched well with lips flanged and immediately went into a good rhythmic jaw motion.  Mom denies discomfort and states it feels like a tug during feed.  Mom was encouraged to use breast compression and massage with feed in order to stimulate infant suck/swallowing.  LC reviewed waking techniques with mom and dad. [employer] given as well as Interior and spatial designerresource sheet and support group information.  Pt. Encouraged to call out for assistance or for questions or concerns regarding feeds.  Patient Name: Girl Carolin SicksRaejah Eisenbeis Today's Date: 02/27/2017 Reason for consult: Initial assessment   Maternal Data Formula Feeding for Exclusion: No Has patient been taught Hand Expression?: Yes Does the patient have breastfeeding experience prior to this delivery?: No  Feeding Feeding Type: Breast Fed Length of feed:  (still nursing after 15 min/)  LATCH Score/Interventions Latch: Grasps breast easily, tongue down, lips flanged, rhythmical sucking. Intervention(s): Adjust position;Assist with latch;Breast massage;Breast compression  Audible Swallowing: A few with stimulation Intervention(s): Skin to skin;Hand expression (mom demonstrated hand expression) Intervention(s): Hand expression;Skin to skin  Type of Nipple: Everted at rest and after  stimulation  Comfort (Breast/Nipple): Soft / non-tender     Hold (Positioning): Assistance needed to correctly position infant at breast and maintain latch. Intervention(s): Breastfeeding basics reviewed;Support Pillows;Skin to skin  LATCH Score: 8  Lactation Tools Discussed/Used [facility]   Consult Status Consult Status: Follow-up Date: 02/28/17 Follow-up type: In-patient    Maryruth HancockKelly [facility]anne [facility] 02/27/2017, 9:59 AM

## 2017-02-27 NOTE — Progress Notes (Signed)
UR chart review completed.  

## 2017-02-28 MED ORDER — RHO D IMMUNE GLOBULIN 1500 UNIT/2ML IJ SOSY
300.0000 ug | PREFILLED_SYRINGE | Freq: Once | INTRAMUSCULAR | Status: AC
  Administered 2017-02-28: 300 ug via INTRAVENOUS
  Filled 2017-02-28: qty 2

## 2017-02-28 MED ORDER — IBUPROFEN 600 MG PO TABS: 600.0000 mg | ORAL_TABLET | Freq: Four times a day (QID) | ORAL | 0 refills | Status: DC

## 2017-02-28 MED ORDER — MEDROXYPROGESTERONE ACETATE 150 MG/ML IM SUSP
150.0000 mg | Freq: Once | INTRAMUSCULAR | Status: DC
  Filled 2017-02-28 (×2): qty 1

## 2017-02-28 MED ORDER — OXYCODONE HCL 5 MG PO TABS
5.0000 mg | ORAL_TABLET | Freq: Four times a day (QID) | ORAL | Status: DC | PRN
  Administered 2017-02-28: 5 mg via ORAL
  Filled 2017-02-28: qty 1

## 2017-02-28 NOTE — Discharge Instructions (Signed)
Breastfeeding °Deciding to breastfeed is one of the best choices you can make for you and your baby. A change in hormones during pregnancy causes your breast tissue to grow and increases the number and size of your milk ducts. These hormones also allow proteins, sugars, and fats from your blood supply to make breast milk in your milk-producing glands. Hormones prevent breast milk from being released before your baby is born as well as prompt milk flow after birth. Once breastfeeding has begun, thoughts of your baby, as well as his or her sucking or crying, can stimulate the release of milk from your milk-producing glands. °Benefits of breastfeeding °For Your Baby °· Your first milk (colostrum) helps your baby's digestive system function better. °· There are antibodies in your milk that help your baby fight off infections. °· Your baby has a lower incidence of asthma, allergies, and sudden infant death syndrome. °· The nutrients in breast milk are better for your baby than infant formulas and are designed uniquely for your baby’s needs. °· Breast milk improves your baby's brain development. °· Your baby is less likely to develop other conditions, such as childhood obesity, asthma, or type 2 diabetes mellitus. ° °For You °· Breastfeeding helps to create a very special bond between you and your baby. °· Breastfeeding is convenient. Breast milk is always available at the correct temperature and costs nothing. °· Breastfeeding helps to burn calories and helps you lose the weight gained during pregnancy. °· Breastfeeding makes your uterus contract to its prepregnancy size faster and slows bleeding (lochia) after you give birth. °· Breastfeeding helps to lower your risk of developing type 2 diabetes mellitus, osteoporosis, and breast or ovarian cancer later in life. ° °Signs that your baby is hungry °Early Signs of Hunger °· Increased alertness or activity. °· Stretching. °· Movement of the head from side to  side. °· Movement of the head and opening of the mouth when the corner of the mouth or cheek is stroked (rooting). °· Increased sucking sounds, smacking lips, cooing, sighing, or squeaking. °· Hand-to-mouth movements. °· Increased sucking of fingers or hands. ° °Late Signs of Hunger °· Fussing. °· Intermittent crying. ° °Extreme Signs of Hunger °Signs of extreme hunger will require calming and consoling before your baby will be able to breastfeed successfully. Do not wait for the following signs of extreme hunger to occur before you initiate breastfeeding: °· Restlessness. °· A loud, strong cry. °· Screaming. ° °Breastfeeding basics °Breastfeeding Initiation °· Find a comfortable place to sit or lie down, with your neck and back well supported. °· Place a pillow or rolled up blanket under your baby to bring him or her to the level of your breast (if you are seated). Nursing pillows are specially designed to help support your arms and your baby while you breastfeed. °· Make sure that your baby's abdomen is facing your abdomen. °· Gently massage your breast. With your fingertips, massage from your chest wall toward your nipple in a circular motion. This encourages milk flow. You may need to continue this action during the feeding if your milk flows slowly. °· Support your breast with 4 fingers underneath and your thumb above your nipple. Make sure your fingers are well away from your nipple and your baby’s mouth. °· Stroke your baby's lips gently with your finger or nipple. °· When your baby's mouth is open wide enough, quickly bring your baby to your breast, placing your entire nipple and as much of the colored area   around your nipple (areola) as possible into your baby's mouth. °? More areola should be visible above your baby's upper lip than below the lower lip. °? Your baby's tongue should be between his or her lower gum and your breast. °· Ensure that your baby's mouth is correctly positioned around your nipple  (latched). Your baby's lips should create a seal on your breast and be turned out (everted). °· It is common for your baby to suck about 2-3 minutes in order to start the flow of breast milk. ° °Latching °Teaching your baby how to latch on to your breast properly is very important. An improper latch can cause nipple pain and decreased milk supply for you and poor weight gain in your baby. Also, if your baby is not latched onto your nipple properly, he or she may swallow some air during feeding. This can make your baby fussy. Burping your baby when you switch breasts during the feeding can help to get rid of the air. However, teaching your baby to latch on properly is still the best way to prevent fussiness from swallowing air while breastfeeding. °Signs that your baby has successfully latched on to your nipple: °· Silent tugging or silent sucking, without causing you pain. °· Swallowing heard between every 3-4 sucks. °· Muscle movement above and in front of his or her ears while sucking. ° °Signs that your baby has not successfully latched on to nipple: °· Sucking sounds or smacking sounds from your baby while breastfeeding. °· Nipple pain. ° °If you think your baby has not latched on correctly, slip your finger into the corner of your baby’s mouth to break the suction and place it between your baby's gums. Attempt breastfeeding initiation again. °Signs of Successful Breastfeeding °Signs from your baby: °· A gradual decrease in the number of sucks or complete cessation of sucking. °· Falling asleep. °· Relaxation of his or her body. °· Retention of a small amount of milk in his or her mouth. °· Letting go of your breast by himself or herself. ° °Signs from you: °· Breasts that have increased in firmness, weight, and size 1-3 hours after feeding. °· Breasts that are softer immediately after breastfeeding. °· Increased milk volume, as well as a change in milk consistency and color by the fifth day of  breastfeeding. °· Nipples that are not sore, cracked, or bleeding. ° °Signs That Your Baby is Getting Enough Milk °· Wetting at least 1-2 diapers during the first 24 hours after birth. °· Wetting at least 5-6 diapers every 24 hours for the first week after birth. The urine should be clear or pale yellow by 5 days after birth. °· Wetting 6-8 diapers every 24 hours as your baby continues to grow and develop. °· At least 3 stools in a 24-hour period by age 5 days. The stool should be soft and yellow. °· At least 3 stools in a 24-hour period by age 7 days. The stool should be seedy and yellow. °· No loss of weight greater than 10% of birth weight during the first 3 days of age. °· Average weight gain of 4-7 ounces (113-198 g) per week after age 4 days. °· Consistent daily weight gain by age 5 days, without weight loss after the age of 2 weeks. ° °After a feeding, your baby may spit up a small amount. This is common. °Breastfeeding frequency and duration °Frequent feeding will help you make more milk and can prevent sore nipples and breast engorgement. Breastfeed when   you feel the need to reduce the fullness of your breasts or when your baby shows signs of hunger. This is called "breastfeeding on demand." Avoid introducing a pacifier to your baby while you are working to establish breastfeeding (the first 4-6 weeks after your baby is born). After this time you may choose to use a pacifier. Research has shown that pacifier use during the first year of a baby's life decreases the risk of sudden infant death syndrome (SIDS). °Allow your baby to feed on each breast as long as he or she wants. Breastfeed until your baby is finished feeding. When your baby unlatches or falls asleep while feeding from the first breast, offer the second breast. Because newborns are often sleepy in the first few weeks of life, you may need to awaken your baby to get him or her to feed. °Breastfeeding times will vary from baby to baby. However,  the following rules can serve as a guide to help you ensure that your baby is properly fed: °· Newborns (babies 4 weeks of age or younger) may breastfeed every 1-3 hours. °· Newborns should not go longer than 3 hours during the day or 5 hours during the night without breastfeeding. °· You should breastfeed your baby a minimum of 8 times in a 24-hour period until you begin to introduce solid foods to your baby at around 6 months of age. ° °Breast milk pumping °Pumping and storing breast milk allows you to ensure that your baby is exclusively fed your breast milk, even at times when you are unable to breastfeed. This is especially important if you are going back to work while you are still breastfeeding or when you are not able to be present during feedings. Your lactation consultant can give you guidelines on how long it is safe to store breast milk. °A breast pump is a machine that allows you to pump milk from your breast into a sterile bottle. The pumped breast milk can then be stored in a refrigerator or freezer. Some breast pumps are operated by hand, while others use electricity. Ask your lactation consultant which type will work best for you. Breast pumps can be purchased, but some hospitals and breastfeeding support groups lease breast pumps on a monthly basis. A lactation consultant can teach you how to hand express breast milk, if you prefer not to use a pump. °Caring for your breasts while you breastfeed °Nipples can become dry, cracked, and sore while breastfeeding. The following recommendations can help keep your breasts moisturized and healthy: °· Avoid using soap on your nipples. °· Wear a supportive bra. Although not required, special nursing bras and tank tops are designed to allow access to your breasts for breastfeeding without taking off your entire bra or top. Avoid wearing underwire-style bras or extremely tight bras. °· Air dry your nipples for 3-4 minutes after each feeding. °· Use only cotton  bra pads to absorb leaked breast milk. Leaking of breast milk between feedings is normal. °· Use lanolin on your nipples after breastfeeding. Lanolin helps to maintain your skin's normal moisture barrier. If you use pure lanolin, you do not need to wash it off before feeding your baby again. Pure lanolin is not toxic to your baby. You may also hand express a few drops of breast milk and gently massage that milk into your nipples and allow the milk to air dry. ° °In the first few weeks after giving birth, some women experience extremely full breasts (engorgement). Engorgement can make your   breasts feel heavy, warm, and tender to the touch. Engorgement peaks within 3-5 days after you give birth. The following recommendations can help ease engorgement: °· Completely empty your breasts while breastfeeding or pumping. You may want to start by applying warm, moist heat (in the shower or with warm water-soaked hand towels) just before feeding or pumping. This increases circulation and helps the milk flow. If your baby does not completely empty your breasts while breastfeeding, pump any extra milk after he or she is finished. °· Wear a snug bra (nursing or regular) or tank top for 1-2 days to signal your body to slightly decrease milk production. °· Apply ice packs to your breasts, unless this is too uncomfortable for you. °· Make sure that your baby is latched on and positioned properly while breastfeeding. ° °If engorgement persists after 48 hours of following these recommendations, contact your health care provider or a lactation consultant. °Overall health care recommendations while breastfeeding °· Eat healthy foods. Alternate between meals and snacks, eating 3 of each per day. Because what you eat affects your breast milk, some of the foods may make your baby more irritable than usual. Avoid eating these foods if you are sure that they are negatively affecting your baby. °· Drink milk, fruit juice, and water to  satisfy your thirst (about 10 glasses a day). °· Rest often, relax, and continue to take your prenatal vitamins to prevent fatigue, stress, and anemia. °· Continue breast self-awareness checks. °· Avoid chewing and smoking tobacco. Chemicals from cigarettes that pass into breast milk and exposure to secondhand smoke may harm your baby. °· Avoid alcohol and drug use, including marijuana. °Some medicines that may be harmful to your baby can pass through breast milk. It is important to ask your health care provider before taking any medicine, including all over-the-counter and prescription medicine as well as vitamin and herbal supplements. °It is possible to become pregnant while breastfeeding. If birth control is desired, ask your health care provider about options that will be safe for your baby. °Contact a health care provider if: °· You feel like you want to stop breastfeeding or have become frustrated with breastfeeding. °· You have painful breasts or nipples. °· Your nipples are cracked or bleeding. °· Your breasts are red, tender, or warm. °· You have a swollen area on either breast. °· You have a fever or chills. °· You have nausea or vomiting. °· You have drainage other than breast milk from your nipples. °· Your breasts do not become full before feedings by the fifth day after you give birth. °· You feel sad and depressed. °· Your baby is too sleepy to eat well. °· Your baby is having trouble sleeping. °· Your baby is wetting less than 3 diapers in a 24-hour period. °· Your baby has less than 3 stools in a 24-hour period. °· Your baby's skin or the white part of his or her eyes becomes yellow. °· Your baby is not gaining weight by 5 days of age. °Get help right away if: °· Your baby is overly tired (lethargic) and does not want to wake up and feed. °· Your baby develops an unexplained fever. °This information is not intended to replace advice given to you by your health care provider. Make sure you discuss  any questions you have with your health care provider. °Document Released: 10/03/2005 Document Revised: 03/16/2016 Document Reviewed: 03/27/2013 °Elsevier Interactive Patient Education © 2017 [employer] °Home Care Instructions for Mom °ACTIVITY °·   Gradually return to your regular activities. °· Let yourself rest. Nap while your baby sleeps. °· Avoid lifting anything that is heavier than 10 lb (4.5 kg) until your health care provider says it is okay. °· Avoid activities that take a lot of effort and energy (are strenuous) until approved by your health care provider. Walking at a slow-to-moderate pace is usually safe. °· If you had a cesarean delivery: °? Do not vacuum, climb stairs, or drive a car for 4-6 weeks. °? Have someone help you at home until you feel like you can do your usual activities yourself. °? Do exercises as told by your health care provider, if this applies. ° °VAGINAL BLEEDING °You may continue to bleed for 4-6 weeks after delivery. Over time, the amount of blood usually decreases and the color of the blood usually gets lighter. However, the flow of bright red blood may increase if you have been too active. If you need to use more than one pad in an hour because your pad gets soaked, or if you pass a large clot: °· Lie down. °· Raise your feet. °· Place a cold compress on your lower abdomen. °· Rest. °· Call your health care provider. ° °If you are breastfeeding, your period should return anytime between 8 weeks after delivery and the time that you stop breastfeeding. If you are not breastfeeding, your period should return 6-8 weeks after delivery. °PERINEAL CARE °The perineal area, or perineum, is the part of your body between your thighs. After delivery, this area needs special care. Follow these instructions as told by your health care provider. °· Take warm tub baths for 15-20 minutes. °· Use medicated pads and pain-relieving sprays and creams as told. °· Do not use tampons or douches until  vaginal bleeding has stopped. °· Each time you go to the bathroom: °? Use a peri bottle. °? Change your pad. °? Use towelettes in place of toilet paper until your stitches have healed. °· Do Kegel exercises every day. Kegel exercises help to maintain the muscles that support the vagina, bladder, and bowels. You can do these exercises while you are standing, sitting, or lying down. To do Kegel exercises: °? Tighten the muscles of your abdomen and the muscles that surround your birth canal. °? Hold for a few seconds. °? Relax. °? Repeat until you have done this 5 times in a row. °· To prevent hemorrhoids from developing or getting worse: °? Drink enough fluid to keep your urine clear or pale yellow. °? Avoid straining when having a bowel movement. °? Take over-the-counter medicines and stool softeners as told by your health care provider. ° °BREAST CARE °· Wear a tight-fitting bra. °· Avoid taking over-the-counter pain medicine for breast discomfort. °· Apply ice to the breasts to help with discomfort as needed: °? Put ice in a plastic bag. °? Place a towel between your skin and the bag. °? Leave the ice on for 20 minutes or as told by your health care provider. ° °NUTRITION °· Eat a well-balanced diet. °· Do not try to lose weight quickly by cutting back on calories. °· Take your prenatal vitamins until your postpartum checkup or until your health care provider tells you to stop. ° °POSTPARTUM DEPRESSION °You may find yourself crying for no apparent reason and unable to cope with all of the changes that come with having a newborn. This mood is called postpartum depression. Postpartum depression happens because your hormone levels change after delivery. If you have   postpartum depression, get support from your partner, friends, and family. If the depression does not go away on its own after several weeks, contact your health care provider. °BREAST SELF-EXAM °Do a breast self-exam each month, at the same time of the  month. If you are breastfeeding, check your breasts just after a feeding, when your breasts are less full. If you are breastfeeding and your period has started, check your breasts on day 5, 6, or 7 of your period. °Report any lumps, bumps, or discharge to your health care provider. Know that breasts are normally lumpy if you are breastfeeding. This is temporary, and it is not a health risk. °INTIMACY AND SEXUALITY °Avoid sexual activity for at least 3-4 weeks after delivery or until the brownish-red vaginal flow is completely gone. If you want to avoid pregnancy, use some form of birth control. You can get pregnant after delivery, even if you have not had your period. °SEEK MEDICAL CARE IF: °· You feel unable to cope with the changes that a child brings to your life, and these feelings do not go away after several weeks. °· You notice a lump, a bump, or discharge on your breast. ° °SEEK IMMEDIATE MEDICAL CARE IF: °· Blood soaks your pad in 1 hour or less. °· You have: °? Severe pain or cramping in your lower abdomen. °? A bad-smelling vaginal discharge. °? A fever that is not controlled by medicine. °? A fever, and an area of your breast is red and sore. °? Pain or redness in your calf. °? Sudden, severe chest pain. °? Shortness of breath. °? Painful or bloody urination. °? Problems with your vision. °· You vomit for 12 hours or longer. °· You develop a severe headache. °· You have serious thoughts about hurting yourself, your child, or anyone else. ° °This information is not intended to replace advice given to you by your health care provider. Make sure you discuss any questions you have with your health care provider. °Document Released: 09/30/2000 Document Revised: 03/10/2016 Document Reviewed: 04/06/2015 °Elsevier Interactive Patient Education © 2017 [employer] ° °

## 2017-02-28 NOTE — Lactation Note (Signed)
This note was copied from a baby's chart. Lactation Consultation Note  Baby 40 hours old.  Grandmother holding infant with pacifier in mouth. Pacifier use not recommended at this time.  Mother states baby has been spitty.  Provided education. Discussed burping, cluster feeding, updated feedings. No questions or concerns at this time.   Patient Name: [name] ZOXWR'UToday's Date: 02/28/2017 Reason for consult: Follow-up assessment   Maternal Data    Feeding Feeding Type: Breast Fed Length of feed: 17 min  LATCH Score/Interventions                      Lactation Tools Discussed/Used     Consult Status Consult Status: Follow-up Date: 03/01/17 Follow-up type: In-patient    Dahlia ByesBerkelhammer, Vinayak [facility] LLC[facility] 02/28/2017, 10:38 PM

## 2017-02-28 NOTE — Progress Notes (Signed)
Post Partum Day #1 Subjective: no complaints, up ad lib, voiding, tolerating PO and reports normal lochia  Objective: Blood pressure 111/72, pulse 61, temperature 97.5 F (36.4 C), temperature source Oral, resp. rate 18, height 5\' 5"  (1.651 m), weight 65.3 kg (144 lb), last menstrual period 05/24/2016, SpO2 100 %, unknown if currently breastfeeding.  Physical Exam:  General: alert Lochia: appropriate Uterine Fundus: firm DVT Evaluation: No evidence of DVT seen on physical exam.   Recent Labs  02/26/17 2225  HGB 12.2  HCT 36.9    Assessment/Plan: Plan for discharge tomorrow Breastfeeding Depo provera   LOS: 2 days   Allie BossierMyra C Xianna Siverling 02/28/2017, 7:49 AM

## 2017-03-01 LAB — RH IG WORKUP (INCLUDES ABO/RH)
ABO/RH(D): A NEG
Fetal Screen: NEGATIVE
GESTATIONAL AGE(WKS): 38.4
Unit division: 0

## 2017-03-01 MED ORDER — OXYCODONE HCL 5 MG PO TABS: 5.0000 mg | ORAL_TABLET | Freq: Four times a day (QID) | ORAL | 0 refills | Status: DC | PRN

## 2017-03-01 NOTE — Progress Notes (Signed)
D/c mom  Possible make baby pt   Waiting on blood work from Development worker, [facility]

## 2017-03-01 NOTE — Progress Notes (Signed)
MOB was referred for history of depression/anxiety. * Referral screened out by Clinical Social Worker because none of the following criteria appear to apply: ~ History of anxiety/depression during this pregnancy, or of post-partum depression. ~ Diagnosis of anxiety and/or depression within last 3 years OR * MOB's symptoms currently being treated with medication and/or therapy. Please contact the Clinical Social Worker if needs arise, or if MOB requests.  CSW reviewed chart and notes no current documentation of symptoms of Anxiety.

## 2017-03-01 NOTE — Lactation Note (Signed)
This note was copied from a baby's chart. Lactation Consultation Note  Patient Name: Natasha Good [id number]'WToday's Date: 03/01/2017 Reason for consult: Follow-up assessment Baby at 51 hr of life and Dyad set for D/C today. Upon entry Dad was sleeping on the couch with baby sleeping on his chest. RN in room and will talk about safe sleep. Mom reports baby is latching well. She has "some nipples soreness", no skin break down or bruising at this time. Discussed baby behavior, feeding frequency, pumping, milk handling, baby belly size, pacifier/bottle use, voids, wt loss, breast changes, and nipple care. Mom reports having a DEBP at home that she knows how to use. Mom is aware of lactation services and support group. She will call as needed.    Maternal Data    Feeding Feeding Type: Breast Milk  LATCH Score/Interventions                      Lactation Tools Discussed/Used     Consult Status Consult Status: Complete Follow-up type: Call as needed    Natasha Good 03/01/2017, 9:40 AM

## 2017-03-01 NOTE — Discharge Summary (Signed)
OB Discharge Summary  Patient Name: Natasha Good DOB: [birth year] MRN: [id number]  Date of admission: 02/26/2017 Delivering MD: Michaele Offer   Date of discharge: 03/01/2017  Admitting diagnosis: 38WKS CTX 2-3MINS Intrauterine pregnancy: [redacted]w[redacted]d     Secondary diagnosis:Active Problems:   Indication for care in labor or delivery      Discharge diagnosis: Term Pregnancy Delivered                                                                      Complications: None  Hospital course:  Onset of Labor With Vaginal Delivery     21 y.o. yo G1P1001 at [redacted]w[redacted]d was admitted in Active Labor on 02/26/2017. Patient had an uncomplicated labor course as follows:  Membrane Rupture Time/Date: 1:50 AM ,02/27/2017   Intrapartum Procedures: Episiotomy: None [1]                                         Lacerations:  None [1]  Patient had a delivery of a Viable infant. 02/27/2017  Information for the patient's newborn:  Deneisha, Dade [[id number]]  Delivery Method: Vaginal, Spontaneous Delivery (Filed from Delivery Summary)    Pateint had an uncomplicated postpartum course.  She is ambulating, tolerating a regular diet, passing flatus, and urinating well. Patient is discharged home in stable condition on 03/01/17.   Physical exam  Vitals:   02/27/17 1322 02/27/17 1735 02/28/17 0536 02/28/17 1822  BP: 110/71 110/67 111/72 109/66  Pulse: 63 65 61 92  Resp: 17 16 18 18   Temp: 98.1 F (36.7 C) 98.1 F (36.7 C) 97.5 F (36.4 C) 98.7 F (37.1 C)  TempSrc: Oral Oral Oral Oral  SpO2:    99%  Weight:      Height:       General: alert Lochia: appropriate Uterine Fundus: firm Incision: N/A DVT Evaluation: No evidence of DVT seen on physical exam. Labs: Lab Results  Component Value Date   WBC 10.6 (H) 02/26/2017   HGB 12.2 02/26/2017   HCT 36.9 02/26/2017   MCV 91.8 02/26/2017   PLT 193 02/26/2017   CMP Latest Ref Rng & Units 05/03/2015  Glucose 65 - 99 mg/dL 89  BUN 6 - 20  mg/dL 10  Creatinine 0.86 - 5.78 mg/dL 4.69  Sodium 629 - 528 mmol/L 138  Potassium 3.5 - 5.1 mmol/L 3.8  Chloride 101 - 111 mmol/L 106  CO2 22 - 32 mmol/L 25  Calcium 8.9 - 10.3 mg/dL 4.1(L)  Total Protein 6.0 - 8.3 g/dL -  Total Bilirubin 0.3 - 1.2 mg/dL -  Alkaline Phos 47 - 244 U/L -  AST 0 - 37 U/L -  ALT 0 - 35 U/L -    Discharge instruction: per After Visit Summary and "Baby and Me Booklet".  After Visit Meds:    Diet: routine diet  Activity: Advance as tolerated. Pelvic rest for 6 weeks.   Outpatient follow up:4 weeks  Follow up Appt:Future Appointments Date Time Provider Department Center  03/02/2017 2:45 PM [facility] [place] 2 [facility]   Follow up visit: No Follow-up on file.  Postpartum contraception: Depo  Provera  Newborn Data: Live born female  Birth Weight: 6 lb 2.8 oz (2801 g) APGAR: 7, 9  Baby Feeding: Breast Disposition:home with mother   03/01/2017 Allie BossierMyra C Jujuan Dugo, MD

## 2017-03-02 ENCOUNTER — Ambulatory Visit (HOSPITAL_COMMUNITY): Admit: 2017-03-02 | Payer: Medicaid Other

## 2017-03-02 ENCOUNTER — Other Ambulatory Visit (HOSPITAL_COMMUNITY): Payer: Self-pay | Admitting: Obstetrics & Gynecology

## 2017-03-02 LAB — TYPE AND SCREEN
ABO/RH(D): A NEG
Antibody Screen: POSITIVE
DAT, IGG: NEGATIVE
UNIT DIVISION: 0
Unit division: 0

## 2017-03-02 LAB — BPAM RBC
Blood Product Expiration Date: 201806072359
Blood Product Expiration Date: 201806072359
UNIT TYPE AND RH: 600
Unit Type and Rh: 600

## 2017-03-15 ENCOUNTER — Inpatient Hospital Stay (HOSPITAL_COMMUNITY)
Admission: AD | Admit: 2017-03-15 | Discharge: 2017-03-15 | Discharge status: Against Medical Advice | Payer: Medicaid Other | Source: Ambulatory Visit | Attending: Obstetrics and Gynecology | Admitting: Obstetrics and Gynecology

## 2017-03-15 DIAGNOSIS — Z5321 Procedure and treatment not carried out due to patient leaving prior to being seen by health care provider: Secondary | ICD-10-CM | POA: Insufficient documentation

## 2017-03-15 DIAGNOSIS — R102 Pelvic and perineal pain: Secondary | ICD-10-CM | POA: Diagnosis not present

## 2017-03-15 LAB — URINALYSIS, ROUTINE W REFLEX MICROSCOPIC
Bilirubin Urine: NEGATIVE
Glucose, UA: NEGATIVE mg/dL
KETONES UR: NEGATIVE mg/dL
Nitrite: NEGATIVE
PH: 5 (ref 5.0–8.0)
Protein, ur: NEGATIVE mg/dL
SPECIFIC GRAVITY, URINE: 1.016 (ref 1.005–1.030)

## 2017-03-15 NOTE — MAU Note (Signed)
Pt left, signed AMA form.

## 2017-03-15 NOTE — MAU Note (Signed)
Pt had vaginal delivery 2 weeks ago, is having vaginal pain, irritation & itching.  Has light bleeding.

## 2017-03-16 ENCOUNTER — Emergency Department (HOSPITAL_BASED_OUTPATIENT_CLINIC_OR_DEPARTMENT_OTHER)
Admission: EM | Admit: 2017-03-16 | Discharge: 2017-03-16 | Disposition: A | Discharge status: Home / Self Care | Payer: Medicaid Other | Attending: Emergency Medicine | Admitting: Emergency Medicine

## 2017-03-16 ENCOUNTER — Encounter (HOSPITAL_BASED_OUTPATIENT_CLINIC_OR_DEPARTMENT_OTHER): Payer: Self-pay | Admitting: *Deleted

## 2017-03-16 DIAGNOSIS — D649 Anemia, unspecified: Secondary | ICD-10-CM | POA: Insufficient documentation

## 2017-03-16 DIAGNOSIS — R102 Pelvic and perineal pain: Secondary | ICD-10-CM | POA: Diagnosis present

## 2017-03-16 DIAGNOSIS — N3000 Acute cystitis without hematuria: Secondary | ICD-10-CM | POA: Insufficient documentation

## 2017-03-16 LAB — URINALYSIS, ROUTINE W REFLEX MICROSCOPIC
BILIRUBIN URINE: NEGATIVE
Glucose, UA: NEGATIVE mg/dL
KETONES UR: NEGATIVE mg/dL
NITRITE: NEGATIVE
PH: 6.5 (ref 5.0–8.0)
Protein, ur: NEGATIVE mg/dL
SPECIFIC GRAVITY, URINE: 1.021 (ref 1.005–1.030)

## 2017-03-16 LAB — URINALYSIS, MICROSCOPIC (REFLEX)

## 2017-03-16 LAB — PREGNANCY, URINE: PREG TEST UR: NEGATIVE

## 2017-03-16 MED ORDER — CEPHALEXIN 500 MG PO CAPS: 500.0000 mg | ORAL_CAPSULE | Freq: Two times a day (BID) | ORAL | 0 refills | Status: DC

## 2017-03-16 MED FILL — CEPHALEXIN 500 MG CAPSULE: 500 | 7 days supply | Qty: 14 | Fill #0

## 2017-03-16 NOTE — ED Triage Notes (Signed)
Vaginal pain. States she feels like she has a UTI. She had a vaginal delivery 2 weeks ago.

## 2017-03-16 NOTE — Discharge Instructions (Signed)
Take your antibiotic as prescribed until completed. I recommended trying to breast-feed prior to taking your dose of antibiotics if possible. Continue drinking fluids at home to remain hydrated. Follow-up with your OB/GYN at your next scheduled appointment or sooner if your symptoms have not improved over the next 3-4 days. Please return to the Emergency Department if symptoms worsen or new onset of fever, abdominal pain, vomiting, vaginal discharge, worsening vaginal bleeding, difficulty urinating.

## 2017-03-16 NOTE — ED Notes (Signed)
Patient denies pain and is resting comfortably.  

## 2017-03-16 NOTE — ED Provider Notes (Signed)
[facility]   CSN: [id number] Arrival date & time: 03/16/17  1519     History   Chief Complaint Chief Complaint  Patient presents with  . Urinary Tract Infection    HPI Natasha Good is a 21 y.o. female.  HPI   Patient is a 21 year old female with no pertinent past medical history presents the ED with complaints of vaginal discomfort. She notes over the past 3-4 days she has had dull aching pain to her pelvic region which she notes is similar with urinary tract infections she has had in the past. Patient reports she had a vaginal delivery 2 weeks ago without any complications. Patient states she has had light vaginal bleeding since her delivery but notes it is gradually improving. Denies fever, chills, abdominal pain, nausea, vomiting, vaginal discharge, urinary frequency, urgency, dysuria, hematuria, rash. Patient denies taking any medications prior to arrival. Patient notes she is currently breast-feeding.  Past Medical History:  Diagnosis Date  . Acid reflux   . Anemia   . Anxiety   . Ovarian cyst     Patient Active Problem List   Diagnosis Date Noted  . Indication for care in labor or delivery 02/26/2017  . Abnormal fetal ultrasound 02/23/2017  . Fetal arrhythmia affecting pregnancy, antepartum 02/23/2017  . Rh negative state in antepartum period 02/23/2017    Past Surgical History:  Procedure Laterality Date  . WISDOM TOOTH EXTRACTION      OB History    Gravida Para Term Preterm AB Living   1 1 1     1    SAB TAB Ectopic Multiple Live Births         0 1       Home Medications    Prior to Admission medications   Medication Sig Start Date End Date Taking? Authorizing Provider  acetaminophen (TYLENOL) 325 MG tablet Take 650 mg by mouth every 6 (six) hours as needed for mild pain.    [provider]  cephALEXin (KEFLEX) 500 MG capsule Take 1 capsule (500 mg total) by mouth 2 (two) times daily. 03/16/17   Barrett HenleNadeau, Lyna Laningham  Elizabeth, PA-C  ibuprofen (ADVIL,MOTRIN) 600 MG tablet TAKE 1 TABLET(600 MG) BY MOUTH EVERY 6 HOURS 03/15/17   Dove, Myra C, MD  oxyCODONE (OXY IR/ROXICODONE) 5 MG immediate release tablet Take 1 tablet (5 mg total) by mouth every 6 (six) hours as needed for breakthrough pain. 03/01/17   Diallo, Lilia ArgueAbdoulaye, MD  Prenatal Vit-Fe Fumarate-FA (PRENATAL MULTIVITAMIN) TABS tablet Take 1 tablet by mouth daily at 12 noon.    [provider]    Family History Family History  Problem Relation Age of Onset  . Heart disease Father     Social History Social History  Substance Use Topics  . Smoking status: Never Smoker  . Smokeless tobacco: Never Used  . Alcohol use No     Allergies   Patient has no known allergies.   Review of Systems Review of Systems  Genitourinary: Positive for vaginal pain.  All other systems reviewed and are negative.    Physical Exam Updated Vital Signs BP 109/85 (BP Location: Left Arm)   Pulse 76   Temp 98.5 F (36.9 C) (Oral)   Resp 17   SpO2 100%   Physical Exam  Constitutional: She is oriented to person, place, and time. She appears well-developed and well-nourished. No distress.  HENT:  Head: Normocephalic and atraumatic.  Mouth/Throat: Oropharynx is clear and moist. No oropharyngeal exudate.  Eyes:  Conjunctivae and EOM are normal. Right eye exhibits no discharge. Left eye exhibits no discharge. No scleral icterus.  Neck: Normal range of motion. Neck supple.  Cardiovascular: Normal rate, regular rhythm, normal heart sounds and intact distal pulses.   Pulmonary/Chest: Effort normal and breath sounds normal. No respiratory distress. She has no wheezes. She has no rales. She exhibits no tenderness.  Abdominal: Soft. Bowel sounds are normal. She exhibits no distension and no mass. There is no tenderness. There is no rebound and no guarding. No hernia.  No CVA tenderness.  Musculoskeletal: She exhibits no edema.  Neurological: She is alert and  oriented to person, place, and time.  Skin: Skin is warm and dry. She is not diaphoretic.  Nursing note and vitals reviewed.    ED Treatments / Results  Labs (all labs ordered are listed, but only abnormal results are displayed) Labs Reviewed  URINALYSIS, ROUTINE W REFLEX MICROSCOPIC - Abnormal; Notable for the following:       Result Value   Hgb urine dipstick TRACE (*)    Leukocytes, UA MODERATE (*)    All other components within normal limits  URINALYSIS, MICROSCOPIC (REFLEX) - Abnormal; Notable for the following:    Bacteria, UA FEW (*)    Squamous Epithelial / LPF 0-5 (*)    All other components within normal limits  PREGNANCY, URINE    EKG  EKG Interpretation None       Radiology No results found.  Procedures Procedures (including critical care time)  Medications Ordered in ED Medications - No data to display   Initial Impression / Assessment and Plan / ED Course  I have reviewed the triage vital signs and the nursing notes.  Pertinent labs & imaging results that were available during my care of the patient were reviewed by me and considered in my medical decision making (see chart for details).     Patient presents with vaginal discomfort consistent with UTI she has had in the past. Denies fever, abdominal pain, vomiting, hematuria, vaginal discharge, flank pain. VSS. Exam unremarkable. Abdomen soft and nontender. No CVA tenderness. UA consistent with UTI. Plan to discharge patient home with Keflex and outpatient OB/GYN follow-up. Discussed return precautions.  Final Clinical Impressions(s) / ED Diagnoses   Final diagnoses:  Acute cystitis without hematuria    New Prescriptions New Prescriptions   CEPHALEXIN (KEFLEX) 500 MG CAPSULE    Take 1 capsule (500 mg total) by mouth 2 (two) times daily.     Barrett Henle, PA-C 03/16/17 1627    Tegeler, Canary Brim, MD 03/17/17 [phone]

## 2017-06-16 ENCOUNTER — Encounter (HOSPITAL_COMMUNITY): Payer: Self-pay | Admitting: *Deleted

## 2017-06-16 ENCOUNTER — Emergency Department (HOSPITAL_COMMUNITY)
Admission: EM | Admit: 2017-06-16 | Discharge: 2017-06-16 | Disposition: A | Discharge status: Home / Self Care | Payer: Medicaid Other | Attending: Emergency Medicine | Admitting: Emergency Medicine

## 2017-06-16 DIAGNOSIS — Z79899 Other long term (current) drug therapy: Secondary | ICD-10-CM | POA: Diagnosis not present

## 2017-06-16 DIAGNOSIS — L243 Irritant contact dermatitis due to cosmetics: Secondary | ICD-10-CM

## 2017-06-16 DIAGNOSIS — L299 Pruritus, unspecified: Secondary | ICD-10-CM | POA: Diagnosis not present

## 2017-06-16 DIAGNOSIS — R21 Rash and other nonspecific skin eruption: Secondary | ICD-10-CM | POA: Diagnosis present

## 2017-06-16 MED ORDER — HYDROCORTISONE 2.5 % EX LOTN: TOPICAL_LOTION | Freq: Two times a day (BID) | CUTANEOUS | 0 refills | Status: DC

## 2017-06-16 NOTE — ED Triage Notes (Signed)
Pt reports using hair remover on arms and back, now has rash and itching to her back. No acute distress is noted at triage, airway intact.

## 2017-06-16 NOTE — ED Provider Notes (Signed)
[facility] Provider Note   CSN: [id number] Arrival date & time: 06/16/17  1136     History   Chief Complaint Chief Complaint  Patient presents with  . Rash  . Allergic Reaction    HPI Natasha Good is a 21 y.o. female.  Natasha Good is a 21 y.o. Female who presents to the emergency department complaining of an itchy rash to her back after using hair removal cream 2 days ago. Patient reports she used a hair remover cream on her back about 2 days ago and then developed an itchy red rash to her back. No other rashes noted. She's been using aloe gel with little relief. She denies fevers, other rashes or other complaints.   The history is provided by the patient and medical records. No language interpreter was used.  Rash    Allergic Reaction  Presenting symptoms: rash   Presenting symptoms: no difficulty swallowing     Past Medical History:  Diagnosis Date  . Acid reflux   . Anemia   . Anxiety   . Ovarian cyst     Patient Active Problem List   Diagnosis Date Noted  . Indication for care in labor or delivery 02/26/2017  . Abnormal fetal ultrasound 02/23/2017  . Fetal arrhythmia affecting pregnancy, antepartum 02/23/2017  . Rh negative state in antepartum period 02/23/2017    Past Surgical History:  Procedure Laterality Date  . WISDOM TOOTH EXTRACTION      OB History    Gravida Para Term Preterm AB Living   1 1 1     1    SAB TAB Ectopic Multiple Live Births         0 1       Home Medications    Prior to Admission medications   Medication Sig Start Date End Date Taking? Authorizing Provider  acetaminophen (TYLENOL) 325 MG tablet Take 650 mg by mouth every 6 (six) hours as needed for mild pain.    [provider]  cephALEXin (KEFLEX) 500 MG capsule Take 1 capsule (500 mg total) by mouth 2 (two) times daily. 03/16/17   Barrett HenleNadeau, Nicole Elizabeth, PA-C  hydrocortisone 2.5 % lotion Apply topically 2 (two) times daily. 06/16/17   Everlene Farrieransie, Bruna Dills,  PA-C  ibuprofen (ADVIL,MOTRIN) 600 MG tablet TAKE 1 TABLET(600 MG) BY MOUTH EVERY 6 HOURS 03/15/17   Dove, Myra C, MD  oxyCODONE (OXY IR/ROXICODONE) 5 MG immediate release tablet Take 1 tablet (5 mg total) by mouth every 6 (six) hours as needed for breakthrough pain. 03/01/17   Diallo, Lilia ArgueAbdoulaye, MD  Prenatal Vit-Fe Fumarate-FA (PRENATAL MULTIVITAMIN) TABS tablet Take 1 tablet by mouth daily at 12 noon.    [provider]    Family History Family History  Problem Relation Age of Onset  . Heart disease Father     Social History Social History  Substance Use Topics  . Smoking status: Never Smoker  . Smokeless tobacco: Never Used  . Alcohol use No     Allergies   Patient has no known allergies.   Review of Systems Review of Systems  Constitutional: Negative for fever.  HENT: Negative for facial swelling and trouble swallowing.   Respiratory: Negative for shortness of breath.   Gastrointestinal: Negative for abdominal pain.  Skin: Positive for rash.     Physical Exam Updated Vital Signs BP 106/68 (BP Location: Left Arm)   Pulse 84   Temp 98.6 F (37 C) (Oral)   Resp 18   SpO2 100%  Physical Exam  Constitutional: She appears well-developed and well-nourished. No distress.  Nontoxic appearing.  HENT:  Head: Normocephalic and atraumatic.  Mouth/Throat: Oropharynx is clear and moist.  Eyes: Right eye exhibits no discharge. Left eye exhibits no discharge.  Pulmonary/Chest: Effort normal. No respiratory distress.  Abdominal: Soft. There is no tenderness.  Neurological: She is alert. Coordination normal.  Skin: Skin is warm and dry. Capillary refill takes less than 2 seconds. Rash noted. She is not diaphoretic. No pallor.  Fine erythematous rash noted diffusely to her back. No vesicles or bulla. No other rashes noted.  Psychiatric: She has a normal mood and affect. Her behavior is normal.  Nursing note and vitals reviewed.    ED Treatments / Results   Labs (all labs ordered are listed, but only abnormal results are displayed) Labs Reviewed - No data to display  EKG  EKG Interpretation None       Radiology No results found.  Procedures Procedures (including critical care time)  Medications Ordered in ED Medications - No data to display   Initial Impression / Assessment and Plan / ED Course  I have reviewed the triage vital signs and the nursing notes.  Pertinent labs & imaging results that were available during my care of the patient were reviewed by me and considered in my medical decision making (see chart for details).    This is a 21 y.o. Female who presents to the emergency department complaining of an itchy rash to her back after using hair removal cream 2 days ago. Patient reports she used a hair remover cream on her back about 2 days ago and then developed an itchy red rash to her back.  On exam patient is afebrile nontoxic appearing. She is a fine erythematous rash noted diffusely to her back. No vesicles or bulla. No other rashes noted. This developed after using hair remover cream. Patient with contact dermatitis. Will start on a steroid cream and have her follow closely with primary care. Return precautions discussed. I advised the patient to follow-up with their primary care provider this week. I advised the patient to return to the emergency department with new or worsening symptoms or new concerns. The patient verbalized understanding and agreement with plan.     Final Clinical Impressions(s) / ED Diagnoses   Final diagnoses:  Irritant contact dermatitis due to cosmetics    New Prescriptions New Prescriptions   HYDROCORTISONE 2.5 % LOTION    Apply topically 2 (two) times daily.     Everlene Farrier, PA-C 06/16/17 1342    Lorre Nick, MD 06/16/17 [phone]

## 2017-06-16 NOTE — Discharge Instructions (Signed)
Please do not use the hair remover cream again. This is likely the cause of your symptoms.

## 2017-06-16 NOTE — ED Notes (Signed)
Red, itching rash all over entire back x 2 days. States had put hair removal lotion on her back 5 days ago.

## 2017-07-02 ENCOUNTER — Encounter (HOSPITAL_BASED_OUTPATIENT_CLINIC_OR_DEPARTMENT_OTHER): Payer: Self-pay | Admitting: Emergency Medicine

## 2017-07-02 ENCOUNTER — Emergency Department (HOSPITAL_BASED_OUTPATIENT_CLINIC_OR_DEPARTMENT_OTHER)
Admission: EM | Admit: 2017-07-02 | Discharge: 2017-07-02 | Disposition: A | Discharge status: Home / Self Care | Payer: Medicaid Other | Attending: Physician Assistant | Admitting: Physician Assistant

## 2017-07-02 DIAGNOSIS — R1031 Right lower quadrant pain: Secondary | ICD-10-CM | POA: Insufficient documentation

## 2017-07-02 DIAGNOSIS — Z79899 Other long term (current) drug therapy: Secondary | ICD-10-CM | POA: Insufficient documentation

## 2017-07-02 DIAGNOSIS — N898 Other specified noninflammatory disorders of vagina: Secondary | ICD-10-CM | POA: Insufficient documentation

## 2017-07-02 DIAGNOSIS — R102 Pelvic and perineal pain: Secondary | ICD-10-CM | POA: Diagnosis present

## 2017-07-02 LAB — BASIC METABOLIC PANEL
Anion gap: 5 (ref 5–15)
BUN: 11 mg/dL (ref 6–20)
CHLORIDE: 103 mmol/L (ref 101–111)
CO2: 26 mmol/L (ref 22–32)
CREATININE: 0.81 mg/dL (ref 0.44–1.00)
Calcium: 9 mg/dL (ref 8.9–10.3)
GFR calc non Af Amer: >60 mL/min (ref >60)
Glucose, Bld: 89 mg/dL (ref 65–99)
Potassium: 4.1 mmol/L (ref 3.5–5.1)
SODIUM: 134 mmol/L — AB (ref 135–145)

## 2017-07-02 LAB — URINALYSIS, MICROSCOPIC (REFLEX)

## 2017-07-02 LAB — CBC WITH DIFFERENTIAL/PLATELET
BASOS PCT: 0 %
Basophils Absolute: 0 10*3/uL (ref 0.0–0.1)
EOS ABS: 0.2 10*3/uL (ref 0.0–0.7)
EOS PCT: 4 %
HCT: 39.9 % (ref 36.0–46.0)
HEMOGLOBIN: 13.1 g/dL (ref 12.0–15.0)
Lymphocytes Relative: 41 %
Lymphs Abs: 1.6 10*3/uL (ref 0.7–4.0)
MCH: 28.9 pg (ref 26.0–34.0)
MCHC: 32.8 g/dL (ref 30.0–36.0)
MCV: 87.9 fL (ref 78.0–100.0)
Monocytes Absolute: 0.4 10*3/uL (ref 0.1–1.0)
Monocytes Relative: 10 %
NEUTROS PCT: 45 %
Neutro Abs: 1.7 10*3/uL (ref 1.7–7.7)
PLATELETS: 227 10*3/uL (ref 150–400)
RBC: 4.54 MIL/uL (ref 3.87–5.11)
RDW: 12.7 % (ref 11.5–15.5)
WBC: 3.9 10*3/uL — AB (ref 4.0–10.5)

## 2017-07-02 LAB — WET PREP, GENITAL
Sperm: NONE SEEN
TRICH WET PREP: NONE SEEN
Yeast Wet Prep HPF POC: NONE SEEN

## 2017-07-02 LAB — URINALYSIS, ROUTINE W REFLEX MICROSCOPIC
Bilirubin Urine: NEGATIVE
GLUCOSE, UA: NEGATIVE mg/dL
Ketones, ur: NEGATIVE mg/dL
Nitrite: NEGATIVE
PH: 6 (ref 5.0–8.0)
PROTEIN: NEGATIVE mg/dL
Specific Gravity, Urine: 1.01 (ref 1.005–1.030)

## 2017-07-02 LAB — PREGNANCY, URINE: Preg Test, Ur: NEGATIVE

## 2017-07-02 MED ORDER — ACYCLOVIR 400 MG PO TABS: 400.0000 mg | ORAL_TABLET | Freq: Four times a day (QID) | ORAL | 0 refills | Status: DC

## 2017-07-02 MED ORDER — MELOXICAM 15 MG PO TABS: 15.0000 mg | ORAL_TABLET | Freq: Every day | ORAL | 0 refills | Status: DC

## 2017-07-02 NOTE — ED Triage Notes (Signed)
Patient reports that she is currently on her period. Reports that she has a history of ovarian cysts. The patient states that she is having lower pelvic pain and has "something like I cut myself on my labia"

## 2017-07-02 NOTE — Discharge Instructions (Signed)

## 2017-07-02 NOTE — ED Provider Notes (Signed)
[facility]   CSN: [id number] Arrival date & time: 07/02/17  1231     History   Chief Complaint Chief Complaint  Patient presents with  . Vaginal Pain    HPI Natasha Good is a 21 y.o. female.Who presents emergency department chief complaint of vaginal lesion and right lower quadrant abdominal pain. Patient states that she has had pain in her right lower quadrant over the past 4 days which is worse when she bends her leg. Patient states that she has been working out and doing a lot of AB exercises. She denies fevers, chills, vaginal symptoms, pain with intercourse. She is in a monogamous relationship with her fianc and has one sexual partner. Patient also complains of swelling and a "cut on the inside of her right labia"she denies any trauma to her vagina. She states that she had this happen once before several years ago. She denies fevers, chills, nausea, vomiting, urinary symptoms. HPI  Past Medical History:  Diagnosis Date  . Acid reflux   . Anemia   . Anxiety   . Ovarian cyst     Patient Active Problem List   Diagnosis Date Noted  . Indication for care in labor or delivery 02/26/2017  . Abnormal fetal ultrasound 02/23/2017  . Fetal arrhythmia affecting pregnancy, antepartum 02/23/2017  . Rh negative state in antepartum period 02/23/2017    Past Surgical History:  Procedure Laterality Date  . WISDOM TOOTH EXTRACTION      OB History    Gravida Para Term Preterm AB Living   SAB TAB Ectopic Multiple Live Births         0 1       Home Medications    Prior to Admission medications   Medication Sig Start Date End Date Taking? Authorizing Provider  acetaminophen (TYLENOL) 325 MG tablet Take 650 mg by mouth every 6 (six) hours as needed for mild pain.    [provider]  cephALEXin (KEFLEX) 500 MG capsule Take 1 capsule (500 mg total) by mouth 2 (two) times daily. 03/16/17   Barrett Henle, PA-C    hydrocortisone 2.5 % lotion Apply topically 2 (two) times daily. 06/16/17   Everlene Farrier, PA-C  ibuprofen (ADVIL,MOTRIN) 600 MG tablet TAKE 1 TABLET(600 MG) BY MOUTH EVERY 6 HOURS 03/15/17   Dove, Myra C, MD  oxyCODONE (OXY IR/ROXICODONE) 5 MG immediate release tablet Take 1 tablet (5 mg total) by mouth every 6 (six) hours as needed for breakthrough pain. 03/01/17   Diallo, Lilia Argue, MD  Prenatal Vit-Fe Fumarate-FA (PRENATAL MULTIVITAMIN) TABS tablet Take 1 tablet by mouth daily at 12 noon.    [provider]    Family History Family History  Problem Relation Age of Onset  . Heart disease Father     Social History Social History  Substance Use Topics  . Smoking status: Never Smoker  . Smokeless tobacco: Never Used  . Alcohol use No     Allergies   Patient has no known allergies.   Review of Systems Review of Systems  Ten systems reviewed and are negative for acute change, except as noted in the HPI.   Physical Exam Updated Vital Signs BP 133/88 (BP Location: Right Arm)   Pulse 97   Temp 98.5 F (36.9 C) (Oral)   Resp 18   Ht  (1.651 m)   Wt 61.2 kg (135 lb)   SpO2 100%   BMI  22.47 kg/m   Physical Exam  Constitutional: She is oriented to person, place, and time. She appears well-developed and well-nourished. No distress.  HENT:  Head: Normocephalic and atraumatic.  Eyes: Conjunctivae are normal. No scleral icterus.  Neck: Normal range of motion.  Cardiovascular: Normal rate, regular rhythm and normal heart sounds.  Exam reveals no gallop and no friction rub.   No murmur heard. Pulmonary/Chest: Effort normal and breath sounds normal. No respiratory distress.  Abdominal: Soft. Bowel sounds are normal. She exhibits no distension and no mass. There is no tenderness. There is no guarding.  Pain with palpation of the psoas muscle. Negative psoas sign.  Genitourinary:  Genitourinary Comments: Pelvic exam: VULVA: Painful ulcerations and mild swelling of  the right labia, VAGINA: normal appearing vagina with normal color and discharge, no lesions, minimal blood in the vaginal vault, CERVIX: normal appearing cervix without discharge or lesions, minimal bloody discharge from the cervix without CMT, UTERUS: uterus is normal size, shape, consistency and nontender, ADNEXA: normal adnexa in size, nontender and no masses.   Neurological: She is alert and oriented to person, place, and time.  Skin: Skin is warm and dry. She is not diaphoretic.  Psychiatric: Her behavior is normal.  Nursing note and vitals reviewed.    ED Treatments / Results  Labs (all labs ordered are listed, but only abnormal results are displayed) Labs Reviewed  WET PREP, GENITAL - Abnormal; Notable for the following:       Result Value   Clue Cells Wet Prep HPF POC PRESENT (*)    WBC, Wet Prep HPF POC MANY (*)    All other components within normal limits  URINALYSIS, ROUTINE W REFLEX MICROSCOPIC - Abnormal; Notable for the following:    Hgb urine dipstick LARGE (*)    Leukocytes, UA TRACE (*)    All other components within normal limits  URINALYSIS, MICROSCOPIC (REFLEX) - Abnormal; Notable for the following:    Bacteria, UA FEW (*)    Squamous Epithelial / LPF 0-5 (*)    All other components within normal limits  CBC WITH DIFFERENTIAL/PLATELET - Abnormal; Notable for the following:    WBC 3.9 (*)    All other components within normal limits  BASIC METABOLIC PANEL - Abnormal; Notable for the following:    Sodium 134 (*)    All other components within normal limits  PREGNANCY, URINE  RPR  HIV ANTIBODY (ROUTINE TESTING)  HSV(HERPES SIMPLEX VRS) I + II AB-IGG  HSV(HERPES SIMPLEX VRS) I + II AB-IGM  GC/CHLAMYDIA PROBE AMP ([place]) NOT AT [facility]    EKG  EKG Interpretation None       Radiology No results found.  Procedures Procedures (including critical care time)  Medications Ordered in ED Medications - No data to display   Initial Impression /  Assessment and Plan / ED Course  I have reviewed the triage vital signs and the nursing notes.  Pertinent labs & imaging results that were available during my care of the patient were reviewed by me and considered in my medical decision making (see chart for details).     Patient with recurrent lesions of the right labia. These appear to be consistent with herpetic infection. Is a benign pelvic exam and her right lower quadrant tenderness appears to be reproducible with palpation of the cell as muscle. Negative for so as her obturator sign, no peritoneal signs. Patient is afebrile without elevated white blood cell count. We'll give the patient return precautions that she  may follow-up with her OB/GYN. She appears safe for discharge at this time Final Clinical Impressions(s) / ED Diagnoses   Final diagnoses:  Vaginal lesion  Right lower quadrant abdominal pain    New Prescriptions New Prescriptions   No medications on file     Arthor Captain, PA-C 07/02/17 1548    Mackuen, Cindee Salt, MD 07/09/17 1002

## 2017-07-03 LAB — GC/CHLAMYDIA PROBE AMP (~~LOC~~) NOT AT ARMC
Chlamydia: NEGATIVE
Neisseria Gonorrhea: NEGATIVE

## 2017-07-04 LAB — HSV(HERPES SIMPLEX VRS) I + II AB-IGM: HSVI/II COMB AB IGM: 1.38 ratio — AB (ref 0.00–0.90)

## 2017-07-04 LAB — HSV(HERPES SIMPLEX VRS) I + II AB-IGG
HSV 1 Glycoprotein G Ab, IgG: <0.91 index (ref 0.00–0.90)
HSV 2 GLYCOPROTEIN G AB, IGG: 11.7 {index} — AB (ref 0.00–0.90)

## 2017-07-04 LAB — HIV ANTIBODY (ROUTINE TESTING W REFLEX): HIV SCREEN 4TH GENERATION: NONREACTIVE

## 2017-07-04 LAB — RPR: RPR: NONREACTIVE

## 2018-04-30 IMAGING — US US OB TRANSVAGINAL
1 series · 15 of 21 positions shown · non-contrast
Comparison: Pelvic ultrasound performed 07/05/2016

CLINICAL DATA: Acute onset of generalized abdominal pain. Initial
encounter.

EXAM:
TRANSVAGINAL OB ULTRASOUND
TECHNIQUE: Transvaginal ultrasound was performed for complete evaluation of the
gestation as well as the maternal uterus, adnexal regions, and
pelvic cul-de-sac.

[Series 1: us ob transvaginal · 21 acquisitions, 15 frames shown]
[im 1/21]
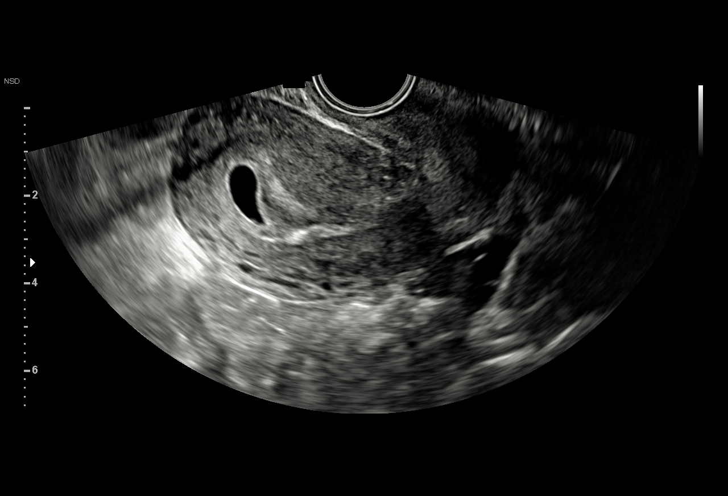
[im 3/21]
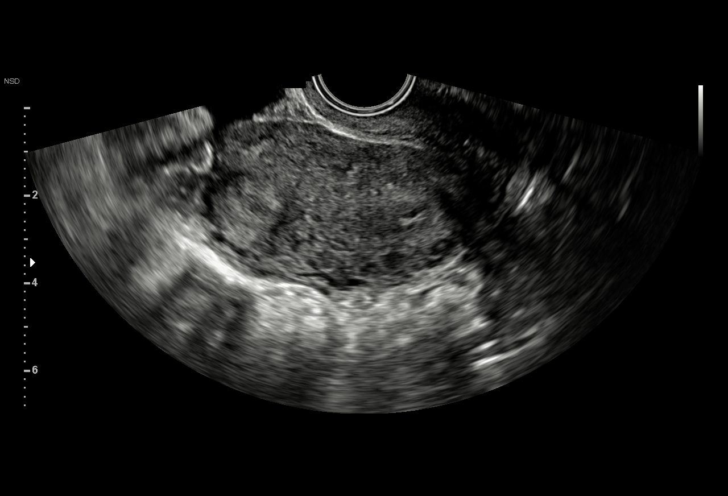
[im 4/21]
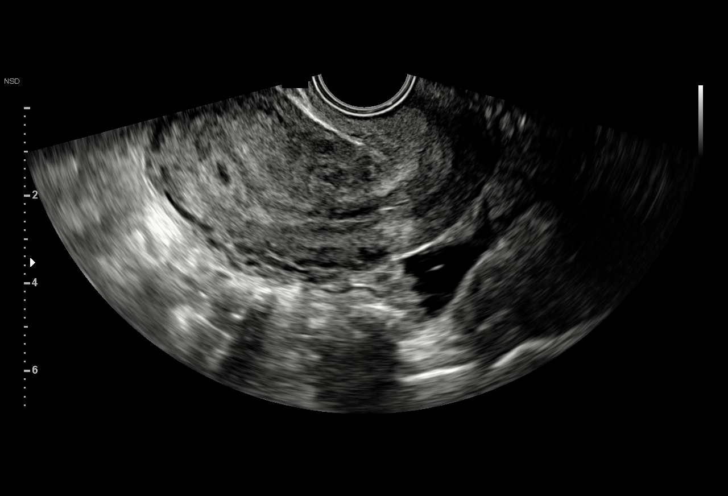
[im 5/21]
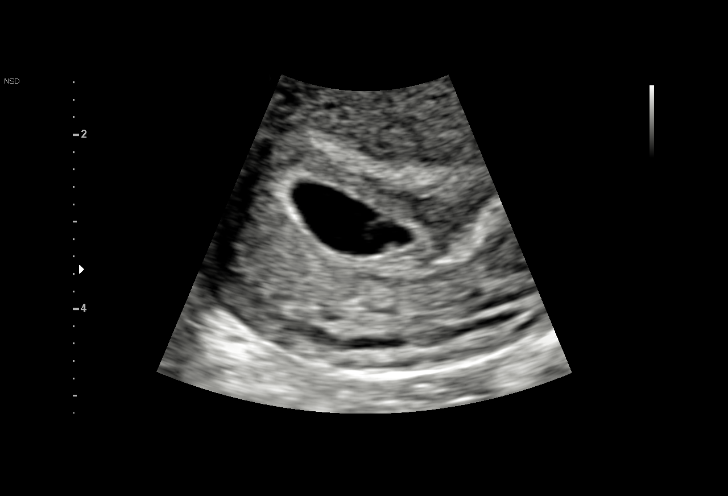
[im 7/21]
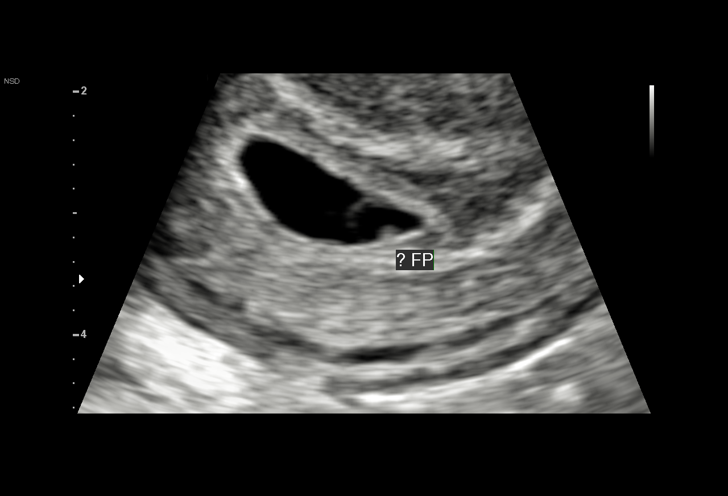
[im 8/21]
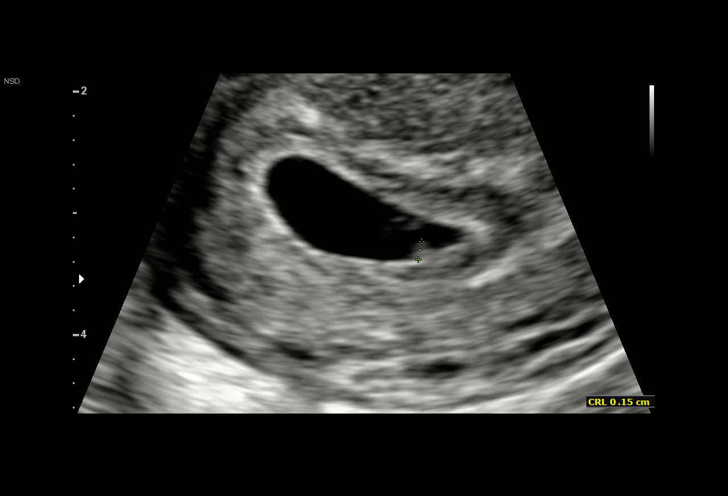
[im 10/21]
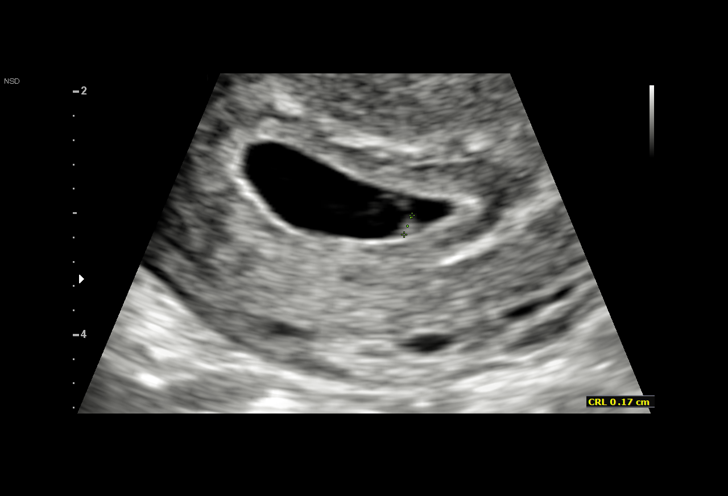
[im 11/21]
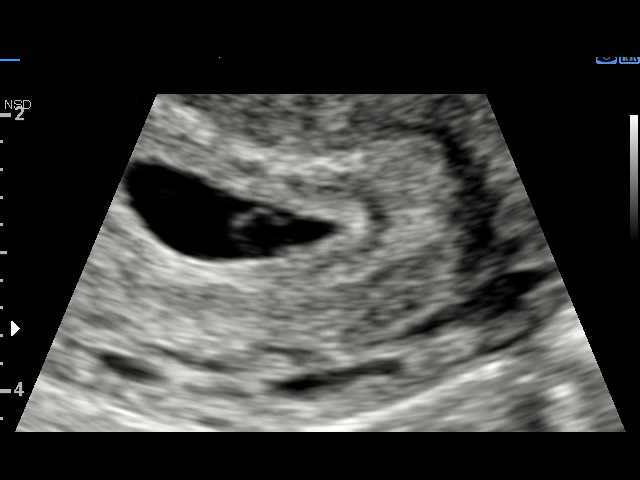
[im 12/21]
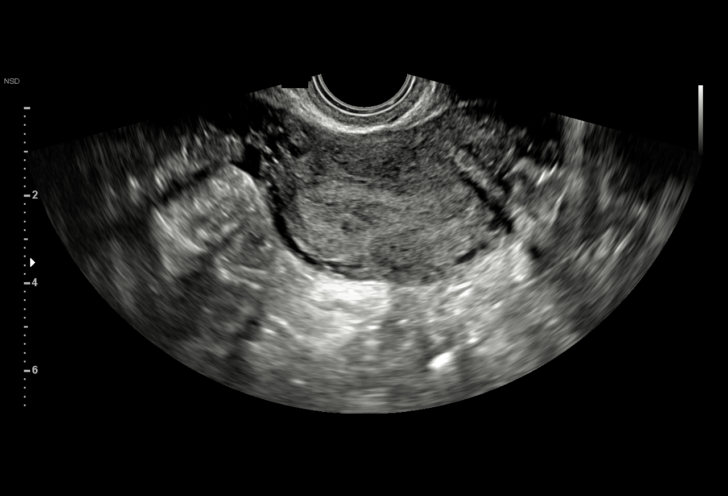
[im 14/21]
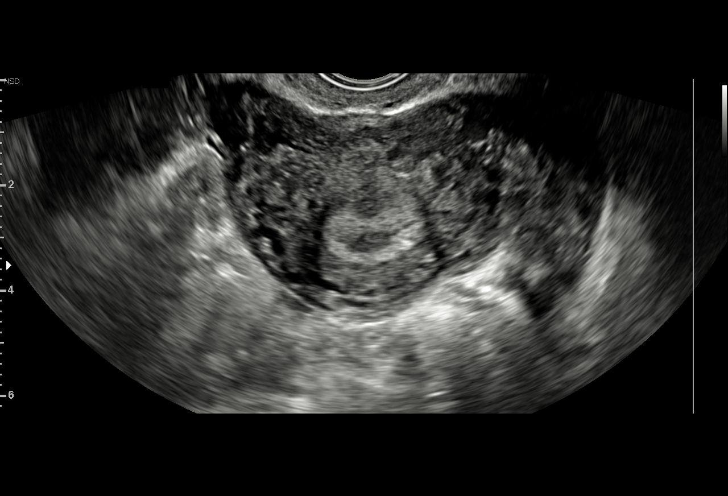
[im 15/21]
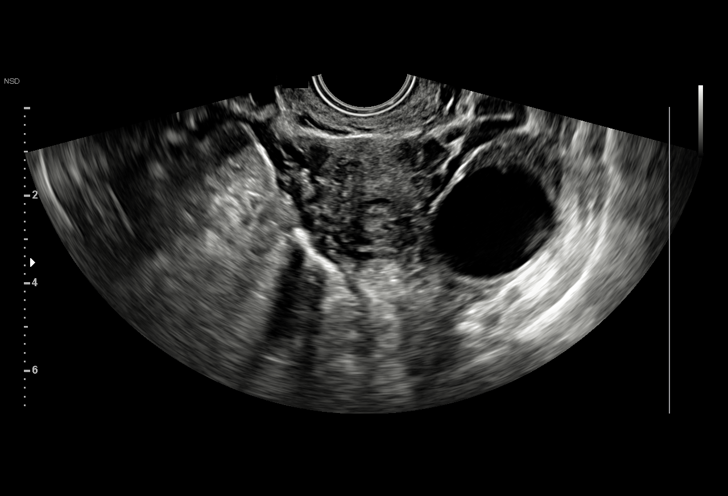
[im 17/21]
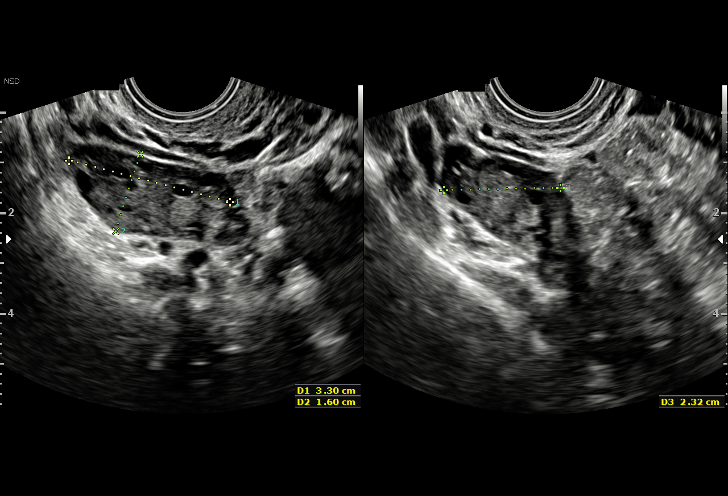
[im 18/21]
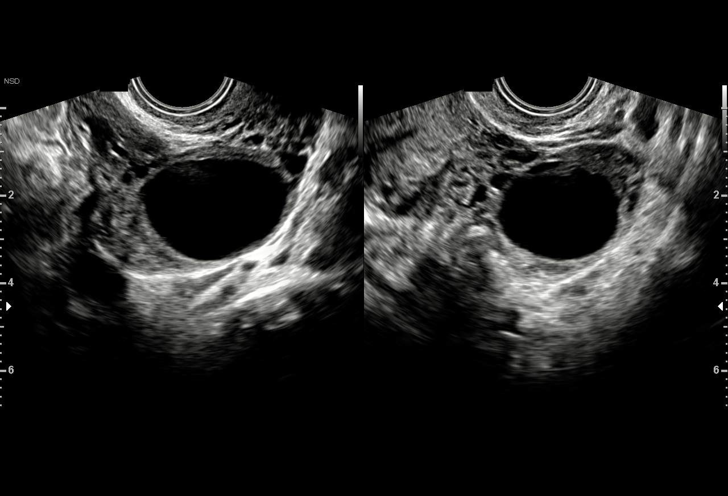
[im 19/21]
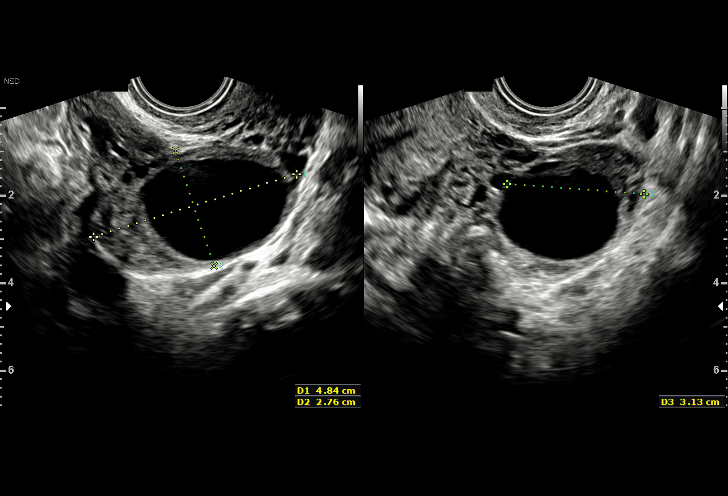
[im 21/21]
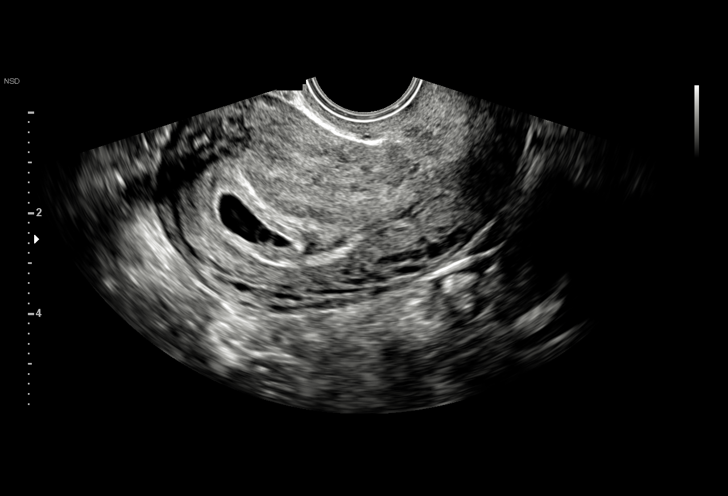

[15 of 21 positions shown; findings below may reference images not displayed]

FINDINGS: Intrauterine gestational sac: Single; visualized and normal in
shape.

Yolk sac:  Yes

Embryo:  Yes

Cardiac Activity: Yes

Heart Rate: Too small to characterize at this time

CRL:   1.6  mm   Still too small to calculate a gestational age.

Subchorionic hemorrhage:  None visualized.

Maternal uterus/adnexae: The uterus is otherwise unremarkable.

The ovaries are within normal limits. The right ovary measures 3.3 x
2.3 x 1.6 cm, while the left ovary measures 4.8 x 2.8 x 3.1 cm. No
suspicious adnexal masses are seen; there is no evidence for ovarian
torsion.

A small amount of free fluid within the pelvic cul-de-sac is likely
physiologic in nature.
IMPRESSION: Single live intrauterine pregnancy noted, with a crown-rump length
of 2 mm. It remains too small to calculate a gestational age at this
time. If the quantitative beta HCG level continues to rise,
follow-up pelvic ultrasound could be considered in 2 weeks to
determine a gestational age.

## 2018-05-08 ENCOUNTER — Ambulatory Visit: Payer: Self-pay | Admitting: Physician Assistant

## 2018-05-09 IMAGING — US US OB TRANSVAGINAL
1 series · 15 of 28 positions shown · non-contrast
Comparison: 07/10/2016

CLINICAL DATA: Abdominal pain affecting pregnancy.

EXAM:
OBSTETRIC <14 WK ULTRASOUND
TECHNIQUE: Transabdominal ultrasound was performed for evaluation of the
gestation as well as the maternal uterus and adnexal regions.

[Series 1: us ob transvaginal · 15 of 28 slices shown]
[im 1/28]
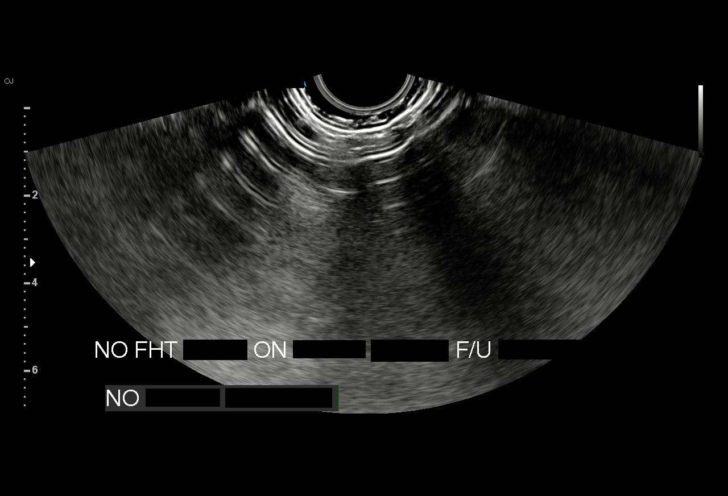
[im 3/28]
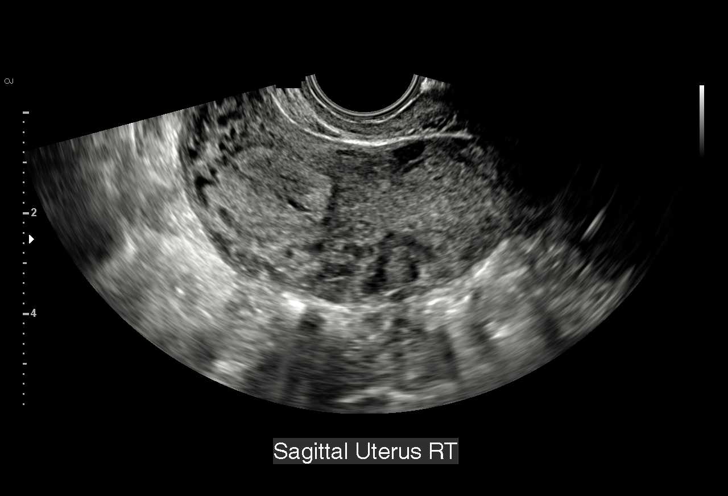
[im 5/28]
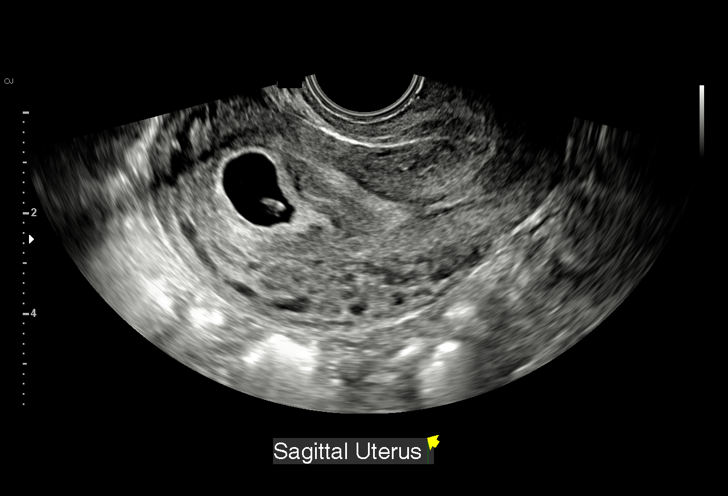
[im 7/28]
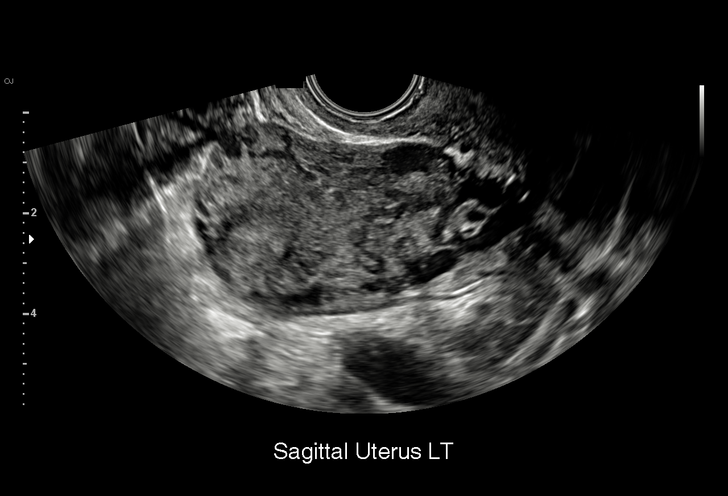
[im 9/28]
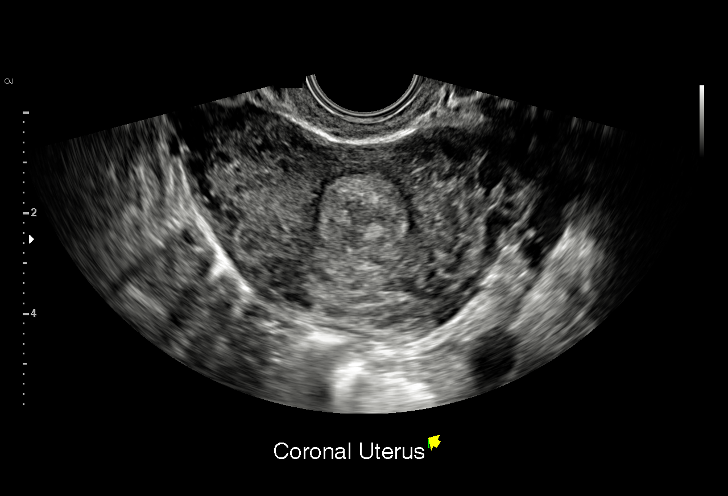
[im 11/28]
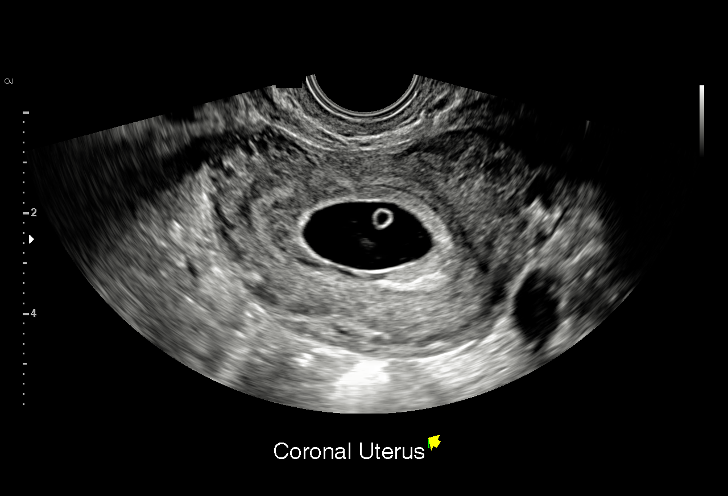
[im 13/28]
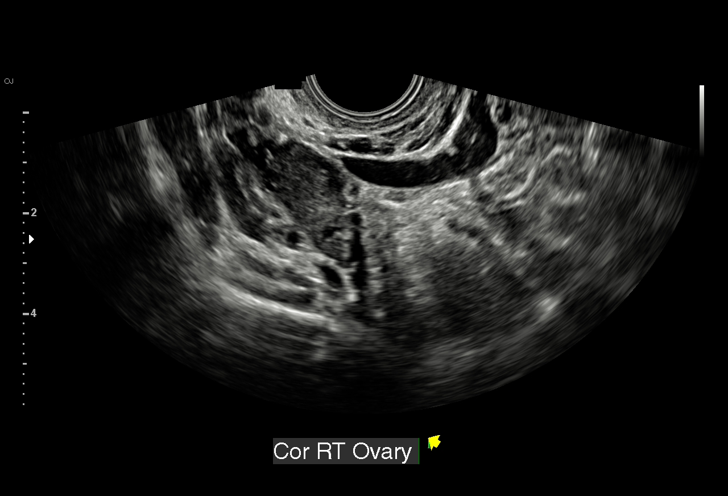
[im 15/28]
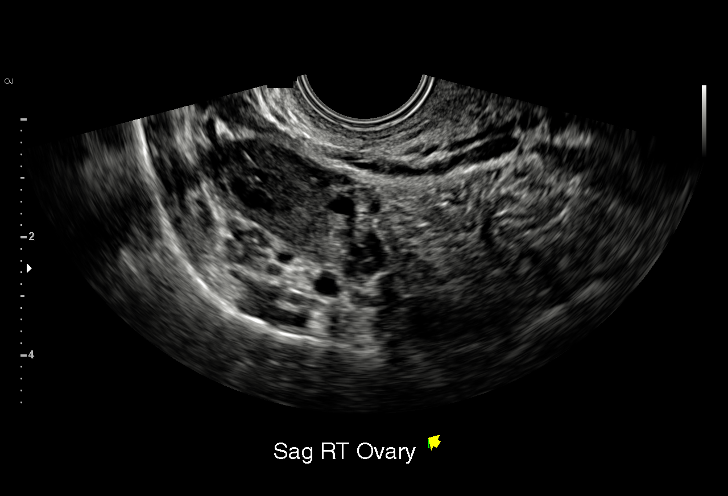
[im 16/28]
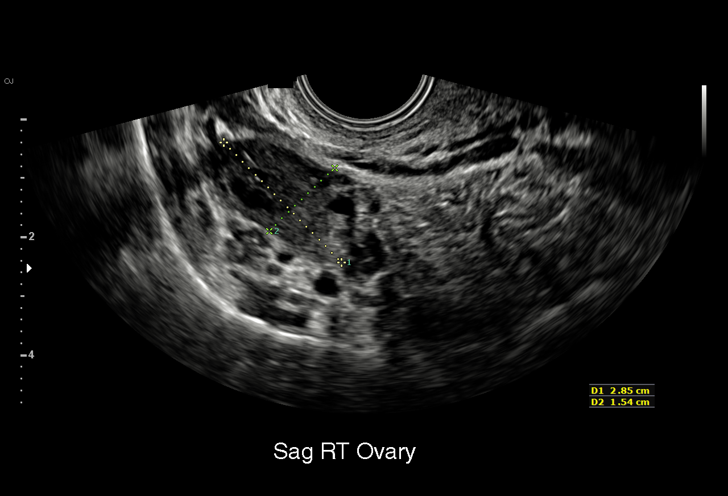
[im 18/28]
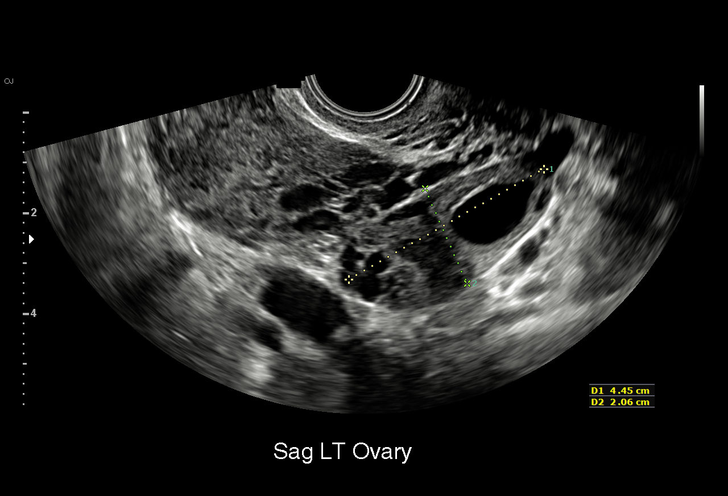
[im 20/28]
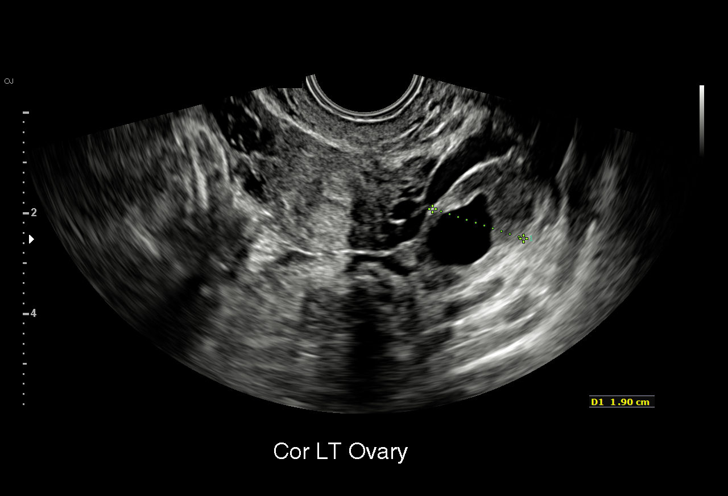
[im 22/28]
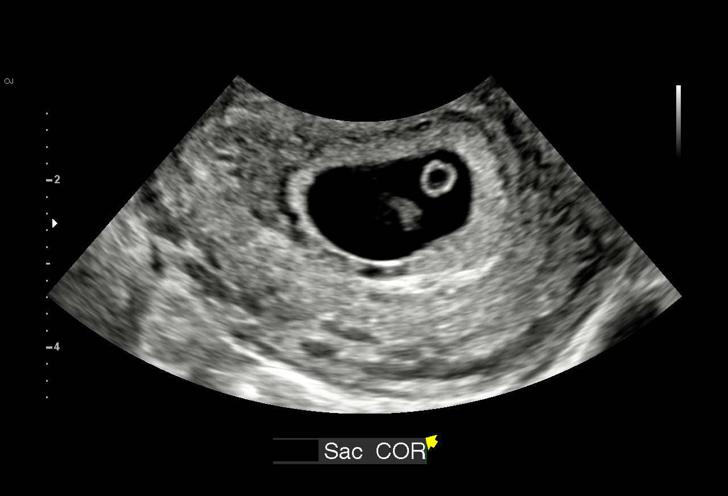
[im 24/28]
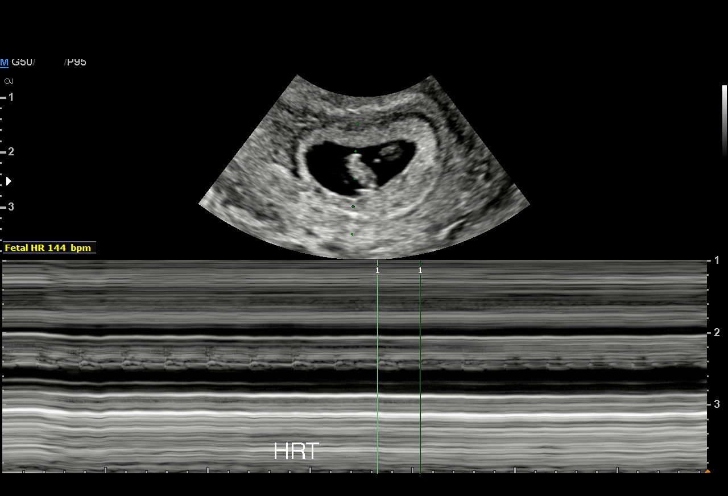
[im 26/28]
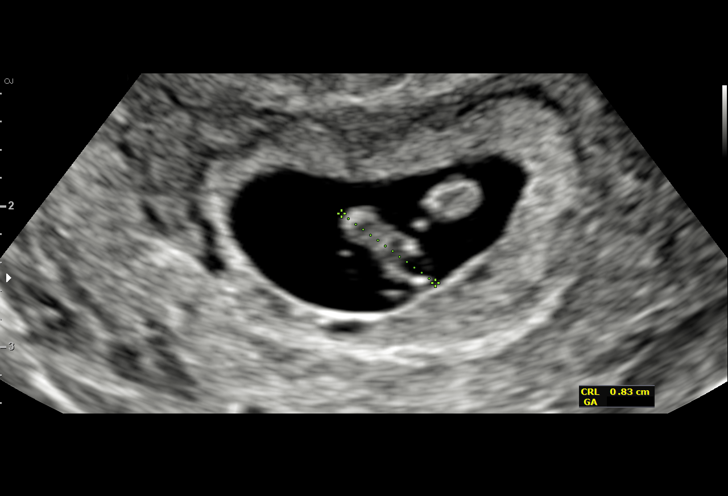
[im 28/28]
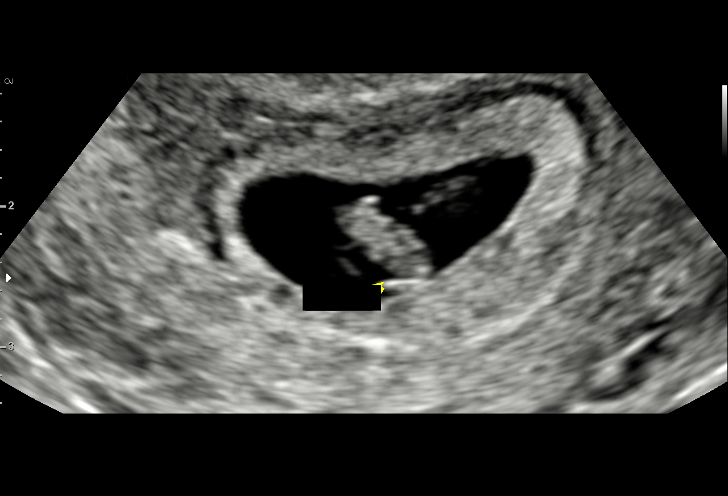

[15 of 28 positions shown; findings below may reference images not displayed]

FINDINGS: Intrauterine gestational sac: Single

Yolk sac:  Present

Embryo:  Present

Cardiac Activity: Present

Heart Rate: 144 bpm

CRL:   8.1  mm   6 w 5d                  US EDC: 03/09/2017

Subchorionic hemorrhage:  None visualized.

Maternal uterus/adnexae: No adnexal mass.  No pelvic free fluid.
IMPRESSION: 1. Single live intrauterine pregnancy dating 6 weeks 5 days with
current ultrasound EDC of 03/09/2017.

## 2019-08-12 ENCOUNTER — Emergency Department: Admit: 2019-08-12 | Payer: TRICARE (CHAMPUS) | Primary: Family Medicine

## 2019-08-12 ENCOUNTER — Inpatient Hospital Stay
Admit: 2019-08-12 | Discharge: 2019-08-13 | Disposition: A | Discharge status: Home / Self Care | Payer: TRICARE (CHAMPUS) | Attending: Emergency Medicine

## 2019-08-12 DIAGNOSIS — S8980XA Other specified injuries of unspecified lower leg, initial encounter: Secondary | ICD-10-CM

## 2019-08-12 MED ORDER — NAPROXEN 250 MG TAB
250 mg | ORAL | Status: AC
Start: 2019-08-12 — End: 2019-08-12
  Administered 2019-08-12: via ORAL

## 2019-08-12 MED FILL — NAPROXEN 250 MG TAB: 250 mg | ORAL | Qty: 2

## 2019-08-12 NOTE — ED Provider Notes (Signed)
ED Provider Notes by Bill Salinasgnibene, Kerrigan Gombos P, MD at 08/12/19 1930                Author: Bill Salinasgnibene, Muhammad Vacca P, MD  Service: --  Author Type: Physician       Filed: 08/13/19 0422  Date of Service: 08/12/19 1930  Status: Signed          Editor: Bill Salinasgnibene, Johnell Landowski P, MD (Physician)               EMERGENCY DEPARTMENT HISTORY AND PHYSICAL EXAM      Date: 08/12/2019   Patient Name: Susan Jimenez        History of Presenting Illness          Chief Complaint       Patient presents with        ?  Knee Pain              History Provided By: Patient      1930   Susan Jimenez is a 23 y.o.  female with PMHX of shinsplints who presents to the emergency department C/O left knee pain.      Patient tells me she was doing a 5 mile trail run today and as she got towards her home she started developing left tib-fib pain.  Described as lateral and radiates medially just under the knee.  Denies any instability of joint or swelling.  Reports prior  history of shin splints in both legs.  Says she runs several miles each day for PT.  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  naproxen (Naprosyn) 500 mg tablet  Take 1 Tab by mouth two (2) times daily as needed for Pain for up to 10 days.  20 Tab  0             Past History        Past Medical History:   No past medical history on file.      Past Surgical History:   No past surgical history on file.      Family History:   No family history on file.      Social History:     Social History          Tobacco Use         ?  Smoking status:  Never Smoker     ?  Smokeless tobacco:  Never Used       Substance Use Topics         ?  Alcohol use:  Never              Frequency:  Never         ?  Drug use:  Never           Allergies:   No Known Allergies           Review of Systems     Review of Systems    Musculoskeletal: Positive for arthralgias.    All other systems reviewed  and are negative.              Physical Exam          Vitals:  08/12/19 1843  08/12/19 2051         BP:  124/80  121/78     Pulse:  71  69     Resp:  14  15     Temp:  97.8 ??F (36.6 ??C)       SpO2:  99%  99%     Weight:  61.2 kg (135 lb)           Height:  5\' 5"  (1.651 m)          Physical Exam   Vitals signs and nursing note reviewed.   Constitutional:        General: She is not in acute distress.     Appearance: Normal appearance. She is not ill-appearing.    HENT:       Head: Normocephalic and atraumatic.   Eyes :       Extraocular Movements: Extraocular movements intact.      Conjunctiva/sclera: Conjunctivae normal.    Neck:       Musculoskeletal: Normal range of motion.   Cardiovascular :       Rate and Rhythm: Normal rate and regular rhythm.   Pulmonary :       Effort: Pulmonary effort is normal. No respiratory distress.   Musculoskeletal: Normal range of motion.          General: No deformity.      Left knee: She exhibits bony tenderness. She exhibits no swelling, no effusion, no deformity, no LCL laxity,  normal patellar mobility, normal meniscus and no MCL laxity. Tenderness found. No medial joint line, no lateral joint line, no MCL, no LCL  and no patellar tendon tenderness noted.        Legs:        Neurological:       General: No focal deficit present.      Mental Status: She is alert and oriented to person, place, and time. Mental status is at baseline.   Psychiatric:         Mood and Affect: Mood normal.         Behavior: Behavior normal.                  Diagnostic Study Results        Labs -    No results found for this or any previous visit (from the past 12 hour(s)).      Radiologic Studies -      XR KNEE LT MIN 4 V       Final Result     IMPRESSION: No acute fractures or subluxation of left knee. Small joint     effusion.                  CT Results   (Last 48 hours)          None                 CXR Results   (Last 48 hours)          None                  Medications given in the ED-      Medications       naproxen (NAPROSYN) tablet 500 mg (500 mg Oral Given 08/12/19 1950)                Medical Decision Making     I am the first provider for this  patient.      I reviewed the vital signs, available nursing notes, past medical history, past surgical history, family history and social history.      Vital Signs-Reviewed the patient's vital signs.      Pulse Oximetry Analysis and Interpretation:    99% on RA, normal         Records Reviewed: Nursing Notes      Provider Notes (Medical Decision Making): Susan Jimenez is a  23 y.o. female here with overuse injury of left knee.  Radiographs reviewed by me  show no bony injury elements of effusion.  Encouraged RICE note provided for no PT for 2 weeks.  Discussed with patient that stress fractures may not show up immediately on XR and if she persists to have [place]      Procedures:   Procedures      ED Course:            Diagnosis and Disposition        Critical Care:      DISCHARGE NOTE:      Susan Jimenez's  results have been reviewed with her .  She has been counseled regarding her  diagnosis, treatment, and plan.  She verbally conveys understanding and agreement of the signs, symptoms, diagnosis, treatment and prognosis  and additionally agrees to follow up as discussed.  She also agrees with the care-plan and conveys that all of  her questions have been answered.  I have also provided discharge instructions for her that  include: educational information regarding their diagnosis and treatment, and list of reasons why they would want to return to the ED prior to their follow-up appointment, should her  condition change. She has been provided with education for proper emergency department utilization.       CLINICAL IMPRESSION:         1.  Overuse injury of lower leg         2.  Effusion of left knee            PLAN:   1. D/C Home   2.      Discharge Medication List as of 08/12/2019  8:30 PM                3.      Follow-up Information               Follow up With  Specialties  Details  Why  [place]              [facility] EMERGENCY DEPT  Emergency Medicine    If symptoms worsen  [address]   [place] [address]   [phone]             _______________________________         Please note that this dictation was completed with Dragon, the computer voice recognition software.  Quite often unanticipated grammatical, syntax, homophones, and other interpretive errors are  inadvertently transcribed by the computer software.  Please disregard these errors.  Please excuse any errors that have escaped final proofreading.

## 2019-08-12 NOTE — ED Notes (Signed)
Pt to ED for left posterior knee pain s/p 5mi run today.  Pt denies injury/trauma.  Pt denies SOB, DVT/PE.

## 2019-08-12 NOTE — ED Notes (Signed)
Pt given prescription with verbal medication information  Pt given verbal and written d/c instructions.

## 2019-08-12 NOTE — ED Triage Notes (Signed)
Pt to ED for left posterior knee pain s/p 5mi run today.  Pt denies injury/trauma.  Pt denies SOB, DVT/PE.

## 2019-08-12 NOTE — ED Notes (Signed)
Pt given prescription with verbal medication information  Pt given verbal and written d/c instructions.

## 2019-08-12 NOTE — ED Provider Notes (Signed)
EMERGENCY DEPARTMENT HISTORY AND PHYSICAL EXAM    Date: 08/12/2019  Patient Name: Susan Jimenez    History of Presenting Illness     Chief Complaint   Patient presents with   ??? Knee Pain         History Provided By: Patient    1930  Susan Jimenez is a 23 y.o. female with PMHX of shinsplints who presents to the emergency department C/O left knee pain.    Patient tells me she was doing a 5 mile trail run today and as she got towards her home she started developing left tib-fib pain.  Described as lateral and radiates medially just under the knee.  Denies any instability of joint or swelling.  Reports prior history of shin splints in both legs.  Says she runs several miles each day for PT.  Patient is active duty Hotel manager at [employer]    PCP: Other, Phys, MD    Current Outpatient Medications   Medication Sig Dispense Refill   ??? naproxen (Naprosyn) 500 mg tablet Take 1 Tab by mouth two (2) times daily as needed for Pain for up to 10 days. 20 Tab 0       Past History     Past Medical History:  No past medical history on file.    Past Surgical History:  No past surgical history on file.    Family History:  No family history on file.    Social History:  Social History     Tobacco Use   ??? Smoking status: Never Smoker   ??? Smokeless tobacco: Never Used   Substance Use Topics   ??? Alcohol use: Never     Frequency: Never   ??? Drug use: Never       Allergies:  No Known Allergies      Review of Systems   Review of Systems   Musculoskeletal: Positive for arthralgias.   All other systems reviewed and are negative.        Physical Exam     Vitals:    08/12/19 1843 08/12/19 2051   BP: 124/80 121/78   Pulse: 71 69   Resp: 14 15   Temp: 97.8 ??F (36.6 ??C)    SpO2: 99% 99%   Weight: 61.2 kg (135 lb)    Height: 5\' 5"  (1.651 m)      Physical Exam  Vitals signs and nursing note reviewed.   Constitutional:       General: She is not in acute distress.     Appearance: Normal appearance. She is not ill-appearing.   HENT:       Head: Normocephalic and atraumatic.   Eyes:      Extraocular Movements: Extraocular movements intact.      Conjunctiva/sclera: Conjunctivae normal.   Neck:      Musculoskeletal: Normal range of motion.   Cardiovascular:      Rate and Rhythm: Normal rate and regular rhythm.   Pulmonary:      Effort: Pulmonary effort is normal. No respiratory distress.   Musculoskeletal: Normal range of motion.         General: No deformity.      Left knee: She exhibits bony tenderness. She exhibits no swelling, no effusion, no deformity, no LCL laxity, normal patellar mobility, normal meniscus and no MCL laxity. Tenderness found. No medial joint line, no lateral joint line, no MCL, no LCL and no patellar tendon tenderness noted.        Legs:  Neurological:      General: No focal deficit present.      Mental Status: She is alert and oriented to person, place, and time. Mental status is at baseline.   Psychiatric:         Mood and Affect: Mood normal.         Behavior: Behavior normal.           Diagnostic Study Results     Labs -   No results found for this or any previous visit (from the past 12 hour(s)).    Radiologic Studies -   XR KNEE LT MIN 4 V   Final Result   IMPRESSION: No acute fractures or subluxation of left knee. Small joint   effusion.         CT Results  (Last 48 hours)    None        CXR Results  (Last 48 hours)    None          Medications given in the ED-  Medications   naproxen (NAPROSYN) tablet 500 mg (500 mg Oral Given 08/12/19 1950)         Medical Decision Making   I am the first provider for this patient.    I reviewed the vital signs, available nursing notes, past medical history, past surgical history, family history and social history.    Vital Signs-Reviewed the patient's vital signs.    Pulse Oximetry Analysis and Interpretation:   99% on RA, normal      Records Reviewed: Nursing Notes    Provider Notes (Medical Decision Making): Susan Jimenez is a 23 y.o.  female here with overuse injury of left knee.  Radiographs reviewed by me show no bony injury elements of effusion.  Encouraged RICE note provided for no PT for 2 weeks.  Discussed with patient that stress fractures may not show up immediately on XR and if she persists to have symptoms [place]    Procedures:  Procedures    ED Course:       Diagnosis and Disposition     Critical Care:    DISCHARGE NOTE:    Susan Jimenez's  results have been reviewed with her.  She has been counseled regarding her diagnosis, treatment, and plan.  She verbally conveys understanding and agreement of the signs, symptoms, diagnosis, treatment and prognosis and additionally agrees to follow up as discussed.  She also agrees with the care-plan and conveys that all of her questions have been answered.  I have also provided discharge instructions for her that include: educational information regarding their diagnosis and treatment, and list of reasons why they would want to return to the ED prior to their follow-up appointment, should her condition change. She has been provided with education for proper emergency department utilization.     CLINICAL IMPRESSION:    1. Overuse injury of lower leg    2. Effusion of left knee        PLAN:  1. D/C Home  2.   Discharge Medication List as of 08/12/2019  8:30 PM        3.   Follow-up Information     Follow up With Specialties Details Why [place]    [facility] EMERGENCY DEPT Emergency Medicine  If symptoms worsen [address]  [place] [address]  [phone]        _______________________________      Please note that this  dictation was completed with Dragon, the computer voice recognition software.  Quite often unanticipated grammatical, syntax, homophones, and other interpretive errors are inadvertently transcribed by the computer software.  Please disregard these errors.  Please excuse any errors that have escaped final proofreading.

## 2019-08-13 MED ORDER — NAPROXEN 500 MG TAB
500 mg | ORAL_TABLET | Freq: Two times a day (BID) | ORAL | 0 refills | Status: AC | PRN
Start: 2019-08-13 — End: 2019-08-22

## 2019-11-08 ENCOUNTER — Inpatient Hospital Stay
Admit: 2019-11-08 | Discharge: 2019-11-08 | Disposition: A | Discharge status: Home / Self Care | Payer: TRICARE (CHAMPUS) | Attending: Emergency Medicine

## 2019-11-08 DIAGNOSIS — N939 Abnormal uterine and vaginal bleeding, unspecified: Secondary | ICD-10-CM

## 2019-11-08 LAB — METABOLIC PANEL, COMPREHENSIVE
A-G Ratio: 0.9 (ref 0.8–1.7)
ALT (SGPT): 24 U/L (ref 13–56)
AST (SGOT): 13 U/L (ref 10–38)
Albumin: 3.8 g/dL (ref 3.4–5.0)
Alk. phosphatase: 67 U/L (ref 45–117)
Anion gap: 0 mmol/L — ABNORMAL LOW (ref 3.0–18)
BUN/Creatinine ratio: 12 (ref 12–20)
BUN: 10 MG/DL (ref 7.0–18)
Bilirubin, total: 0.3 MG/DL (ref 0.2–1.0)
CO2: 34 mmol/L — ABNORMAL HIGH (ref 21–32)
Calcium: 8.9 MG/DL (ref 8.5–10.1)
Chloride: 106 mmol/L (ref 100–111)
Creatinine: 0.86 MG/DL (ref 0.6–1.3)
GFR est AA: >60 mL/min/{1.73_m2} (ref >60)
GFR est non-AA: >60 mL/min/{1.73_m2} (ref >60)
Globulin: 4.2 g/dL — ABNORMAL HIGH (ref 2.0–4.0)
Glucose: 106 mg/dL — ABNORMAL HIGH (ref 74–99)
Potassium: 4.2 mmol/L (ref 3.5–5.5)
Protein, total: 8 g/dL (ref 6.4–8.2)
Sodium: 140 mmol/L (ref 136–145)

## 2019-11-08 LAB — URINALYSIS W/ RFLX MICROSCOPIC
Bilirubin, Urine: NEGATIVE
Bilirubin: NEGATIVE
Blood, Urine: NEGATIVE
Blood: NEGATIVE
Glucose, Ur: NEGATIVE mg/dL
Glucose: NEGATIVE mg/dL
Nitrite, Urine: NEGATIVE
Nitrites: NEGATIVE
Protein, UA: NEGATIVE mg/dL
Protein: NEGATIVE mg/dL
Specific Gravity, UA: 1.027 (ref 1.005–1.030)
Specific gravity: 1.027 (ref 1.005–1.030)
Urobilinogen, UA, POCT: 1 EU/dL (ref 0.2–1.0)
Urobilinogen: 1 EU/dL (ref 0.2–1.0)
pH (UA): 8 (ref 5.0–8.0)
pH, UA: 8 (ref 5.0–8.0)

## 2019-11-08 LAB — WET PREP

## 2019-11-08 LAB — CBC WITH AUTOMATED DIFF
ABS. BASOPHILS: 0 10*3/uL (ref 0.0–0.1)
ABS. EOSINOPHILS: 0.3 10*3/uL (ref 0.0–0.4)
ABS. LYMPHOCYTES: 2.6 10*3/uL (ref 0.9–3.6)
ABS. MONOCYTES: 0.3 10*3/uL (ref 0.05–1.2)
ABS. NEUTROPHILS: 1.6 10*3/uL — ABNORMAL LOW (ref 1.8–8.0)
BASOPHILS: 0 % (ref 0–2)
EOSINOPHILS: 6 % — ABNORMAL HIGH (ref 0–5)
HCT: 38.8 % (ref 35.0–45.0)
HGB: 12.4 g/dL (ref 12.0–16.0)
LYMPHOCYTES: 55 % — ABNORMAL HIGH (ref 21–52)
MCH: 29.2 PG (ref 24.0–34.0)
MCHC: 32 g/dL (ref 31.0–37.0)
MCV: 91.5 FL (ref 74.0–97.0)
MONOCYTES: 6 % (ref 3–10)
MPV: 10.8 FL (ref 9.2–11.8)
NEUTROPHILS: 33 % — ABNORMAL LOW (ref 40–73)
PLATELET: 237 10*3/uL (ref 135–420)
RBC: 4.24 M/uL (ref 4.20–5.30)
RDW: 12.6 % (ref 11.6–14.5)
WBC: 4.8 10*3/uL (ref 4.6–13.2)

## 2019-11-08 LAB — URINE MICROSCOPIC ONLY
BACTERIA, URINE: NEGATIVE /hpf
Bacteria: NEGATIVE /hpf
RBC, UA: NEGATIVE /hpf (ref 0–5)
RBC: NEGATIVE /hpf (ref 0–5)
WBC, UA: 0 /hpf (ref 0–5)
WBC: 0 /hpf (ref 0–5)

## 2019-11-08 LAB — HCG URINE, QL. - POC
HCG, Pregnancy, Urine, POC: NEGATIVE
Pregnancy test,urine (POC): NEGATIVE

## 2019-11-08 LAB — CBC WITH AUTO DIFFERENTIAL
Basophils %: 0 % (ref 0–2)
Basophils Absolute: 0 10*3/uL (ref 0.0–0.1)
Eosinophils %: 6 % — ABNORMAL HIGH (ref 0–5)
Eosinophils Absolute: 0.3 10*3/uL (ref 0.0–0.4)
Hematocrit: 38.8 % (ref 35.0–45.0)
Hemoglobin: 12.4 g/dL (ref 12.0–16.0)
Lymphocytes %: 55 % — ABNORMAL HIGH (ref 21–52)
Lymphocytes Absolute: 2.6 10*3/uL (ref 0.9–3.6)
MCH: 29.2 PG (ref 24.0–34.0)
MCHC: 32 g/dL (ref 31.0–37.0)
MCV: 91.5 FL (ref 74.0–97.0)
MPV: 10.8 FL (ref 9.2–11.8)
Monocytes %: 6 % (ref 3–10)
Monocytes Absolute: 0.3 10*3/uL (ref 0.05–1.2)
Neutrophils %: 33 % — ABNORMAL LOW (ref 40–73)
Neutrophils Absolute: 1.6 10*3/uL — ABNORMAL LOW (ref 1.8–8.0)
Platelets: 237 10*3/uL (ref 135–420)
RBC: 4.24 M/uL (ref 4.20–5.30)
RDW: 12.6 % (ref 11.6–14.5)
WBC: 4.8 10*3/uL (ref 4.6–13.2)

## 2019-11-08 LAB — COMPREHENSIVE METABOLIC PANEL
ALT: 24 U/L (ref 13–56)
AST: 13 U/L (ref 10–38)
Albumin/Globulin Ratio: 0.9 (ref 0.8–1.7)
Albumin: 3.8 g/dL (ref 3.4–5.0)
Alkaline Phosphatase: 67 U/L (ref 45–117)
Anion Gap: 0 mmol/L — ABNORMAL LOW (ref 3.0–18)
BUN: 10 MG/DL (ref 7.0–18)
Bun/Cre Ratio: 12 (ref 12–20)
CO2: 34 mmol/L — ABNORMAL HIGH (ref 21–32)
Calcium: 8.9 MG/DL (ref 8.5–10.1)
Chloride: 106 mmol/L (ref 100–111)
Creatinine: 0.86 MG/DL (ref 0.6–1.3)
EGFR IF NonAfrican American: >60 mL/min/{1.73_m2} (ref >60)
GFR African American: >60 mL/min/{1.73_m2} (ref >60)
Globulin: 4.2 g/dL — ABNORMAL HIGH (ref 2.0–4.0)
Glucose: 106 mg/dL — ABNORMAL HIGH (ref 74–99)
Potassium: 4.2 mmol/L (ref 3.5–5.5)
Sodium: 140 mmol/L (ref 136–145)
Total Bilirubin: 0.3 MG/DL (ref 0.2–1.0)
Total Protein: 8 g/dL (ref 6.4–8.2)

## 2019-11-08 NOTE — ED Notes (Signed)
Reports menstrual cycle x3 weeks. Denies heavy bleeding.

## 2019-11-08 NOTE — ED Provider Notes (Signed)
EMERGENCY DEPARTMENT HISTORY AND PHYSICAL EXAM    Date: 11/08/2019  Patient Name: Susan Jimenez    History of Presenting Illness     Chief Complaint   Patient presents with   ??? Menstrual Problem         History Provided By: Patient    3:05 PM  Susan Jimenez is a 24 y.o. female with no significant PMHX who presents to the emergency department C/O prolonged vaginal bleeding.  Per patient she has had normal periods in the past and recently stopped the patch birth control in November.  She reports she had a normal period at the end of December but since the new year has had prolonged constant vaginal bleeding.  She reports it is not overly heavy and has gone through 4 pads today.  She states is associated with some pelvic cramping.  She denies any fever, cough, chest pain, shortness of breath, bowel or urinary complaints.  No clear relieving or exacerbating factors identified.     PCP: Other, Phys, MD        Past History     Past Medical History:  History reviewed. No pertinent past medical history.    Past Surgical History:  History reviewed. No pertinent surgical history.    Family History:  History reviewed. No pertinent family history.    Social History:  Social History     Tobacco Use   ??? Smoking status: Never Smoker   ??? Smokeless tobacco: Never Used   Substance Use Topics   ??? Alcohol use: Never     Frequency: Never   ??? Drug use: Never       Allergies:  No Known Allergies      Review of Systems   Review of Systems   Constitutional: Negative for fever.   Respiratory: Negative for shortness of breath.    Cardiovascular: Negative for chest pain.   Genitourinary: Positive for pelvic pain and vaginal bleeding.   All other systems reviewed and are negative.        Physical Exam     Vitals:    11/08/19 1454 11/08/19 1546   BP: 122/82    Pulse: 81    Resp: 16    Temp: 97.7 ??F (36.5 ??C)    SpO2: 100% 100%   Weight: 63.5 kg (140 lb)    Height: '5\' 5"'$  (1.651 m)      Physical Exam    Nursing notes and vital signs  reviewed    Constitutional: Non toxic appearing, moderate distress  Head: Normocephalic, Atraumatic  Eyes: EOMI  Neck: Supple  Cardiovascular: Regular rate and rhythm, no murmurs, rubs, or gallops  Chest: Normal work of breathing and chest excursion bilaterally  Lungs: Clear to ausculation bilaterally  Abdomen: Soft, non tender, non distended, normoactive bowel sounds  Pelvic: See below  Back: No evidence of trauma or deformity  Extremities: No evidence of trauma or deformity, no LE edema  Skin: Warm and dry, normal cap refill  Neuro: Alert and appropriate  Psychiatric: Normal mood and affect      Diagnostic Study Results     Labs -     Recent Results (from the past 12 hour(s))   URINALYSIS W/ RFLX MICROSCOPIC    Collection Time: 11/08/19  3:00 PM   Result Value Ref Range    Color YELLOW      Appearance CLEAR      Specific gravity 1.027 1.005 - 1.030      pH (UA) 8.0  5.0 - 8.0      Protein Negative NEG mg/dL    Glucose Negative NEG mg/dL    Ketone TRACE (A) NEG mg/dL    Bilirubin Negative NEG      Blood Negative NEG      Urobilinogen 1.0 0.2 - 1.0 EU/dL    Nitrites Negative NEG      Leukocyte Esterase TRACE (A) NEG     URINE MICROSCOPIC ONLY    Collection Time: 11/08/19  3:00 PM   Result Value Ref Range    WBC 0 to 3 0 - 5 /hpf    RBC Negative 0 - 5 /hpf    Epithelial cells 1+ 0 - 5 /lpf    Bacteria Negative NEG /hpf    Amorphous Crystals 1+ (A) NEG   CBC WITH AUTOMATED DIFF    Collection Time: 11/08/19  3:25 PM   Result Value Ref Range    WBC 4.8 4.6 - 13.2 K/uL    RBC 4.24 4.20 - 5.30 M/uL    HGB 12.4 12.0 - 16.0 g/dL    HCT 38.8 35.0 - 45.0 %    MCV 91.5 74.0 - 97.0 FL    MCH 29.2 24.0 - 34.0 PG    MCHC 32.0 31.0 - 37.0 g/dL    RDW 12.6 11.6 - 14.5 %    PLATELET 237 135 - 420 K/uL    MPV 10.8 9.2 - 11.8 FL    NEUTROPHILS 33 (L) 40 - 73 %    LYMPHOCYTES 55 (H) 21 - 52 %    MONOCYTES 6 3 - 10 %    EOSINOPHILS 6 (H) 0 - 5 %    BASOPHILS 0 0 - 2 %    ABS. NEUTROPHILS 1.6 (L) 1.8 - 8.0 K/UL    ABS. LYMPHOCYTES 2.6 0.9  - 3.6 K/UL    ABS. MONOCYTES 0.3 0.05 - 1.2 K/UL    ABS. EOSINOPHILS 0.3 0.0 - 0.4 K/UL    ABS. BASOPHILS 0.0 0.0 - 0.1 K/UL    DF AUTOMATED     METABOLIC PANEL, COMPREHENSIVE    Collection Time: 11/08/19  3:25 PM   Result Value Ref Range    Sodium 140 136 - 145 mmol/L    Potassium 4.2 3.5 - 5.5 mmol/L    Chloride 106 100 - 111 mmol/L    CO2 34 (H) 21 - 32 mmol/L    Anion gap 0 (L) 3.0 - 18 mmol/L    Glucose 106 (H) 74 - 99 mg/dL    BUN 10 7.0 - 18 MG/DL    Creatinine 0.86 0.6 - 1.3 MG/DL    BUN/Creatinine ratio 12 12 - 20      GFR est AA >60 >60 ml/min/1.45m    GFR est non-AA >60 >60 ml/min/1.768m   Calcium 8.9 8.5 - 10.1 MG/DL    Bilirubin, total 0.3 0.2 - 1.0 MG/DL    ALT (SGPT) 24 13 - 56 U/L    AST (SGOT) 13 10 - 38 U/L    Alk. phosphatase 67 45 - 117 U/L    Protein, total 8.0 6.4 - 8.2 g/dL    Albumin 3.8 3.4 - 5.0 g/dL    Globulin 4.2 (H) 2.0 - 4.0 g/dL    A-G Ratio 0.9 0.8 - 1.7     HCG URINE, QL. - POC    Collection Time: 11/08/19  3:35 PM   Result Value Ref Range    Pregnancy test,urine (POC) Negative NEG  WET PREP    Collection Time: 11/08/19  3:40 PM    Specimen: Vagina   Result Value Ref Range    Special Requests: NO SPECIAL REQUESTS      Wet prep NO YEAST,TRICHOMONAS OR CLUE CELLS NOTED         Radiologic Studies -   No orders to display     CT Results  (Last 48 hours)    None        CXR Results  (Last 48 hours)    None          Medications given in the ED-  Medications - No data to display      Medical Decision Making   I am the first provider for this patient.    I reviewed the vital signs, available nursing notes, past medical history, past surgical history, family history and social history.    Vital Signs-Reviewed the patient's vital signs.    Pulse Oximetry Analysis - 100% on room air, not hypoxic     Records Reviewed: Nursing Notes    Provider Notes (Medical Decision Making): Susan Jimenez is a 24 y.o. female presenting for prolonged vaginal bleeding.  Patient hemodynamically stable with  only scant blood on exam.  No acute abnormalities identified on labs.  Plan for discharge with referral to OB/GYN for follow-up with return precautions.  Patient understands and agrees with this plan.    Procedures:  Procedures    ED Course:   3:41 PM  Exam was chaperoned by Maudie Mercury RN  Normal external genitalia.  Scant blood and physiologic discharge in the vault.   Normal cervix appearance.  On bimanual exam, no cervical, uterine, or adnexal tenderness.       Diagnosis and Disposition     Critical Care: None    DISCHARGE NOTE:    Susan Jimenez's  results have been reviewed with her.  She has been counseled regarding her diagnosis, treatment, and plan.  She verbally conveys understanding and agreement of the signs, symptoms, diagnosis, treatment and prognosis and additionally agrees to follow up as discussed.  She also agrees with the care-plan and conveys that all of her questions have been answered.  I have also provided discharge instructions for her that include: educational information regarding their diagnosis and treatment, and list of reasons why they would want to return to the ED prior to their follow-up appointment, should her condition change. She has been provided with education for proper emergency department utilization.     CLINICAL IMPRESSION:    1. Abnormal vaginal bleeding        PLAN:  1. D/C Home  2. There are no discharge medications for this patient.    3.   Follow-up Information     Follow up With Specialties Details Why Contact Info    Gershon Mussel, MD Obstetrics & Gynecology, Gynecology, Obstetrics Schedule an appointment as soon as possible for a visit   [address]  [address]  [place] [address]  [phone]      [facility] EMERGENCY DEPT Emergency Medicine  If symptoms worsen [address]  [place] [address]  [phone]        _______________________________      Please note that this dictation was completed with Dragon, the computer voice recognition software.  Quite often  unanticipated grammatical, syntax, homophones, and other interpretive errors are inadvertently transcribed by the computer software.  Please disregard these errors.  Please excuse any errors that  have escaped final proofreading.

## 2019-11-08 NOTE — ED Notes (Signed)
I have reviewed discharge instructions with the patient.  The patient verbalized understanding. Patient armband removed and shredded

## 2019-11-08 NOTE — ED Notes (Signed)
POCT HCG performed with negative results.

## 2019-11-08 NOTE — ED Provider Notes (Signed)
EMERGENCY DEPARTMENT HISTORY AND PHYSICAL EXAM    Date: 11/08/2019  Patient Name: Susan Jimenez    History of Presenting Illness     Chief Complaint   Patient presents with   ??? Menstrual Problem         History Provided By: Patient    3:05 PM  Susan Jimenez is a 24 y.o. female with no significant PMHX who presents to the emergency department C/O prolonged vaginal bleeding.  Per patient she has had normal periods in the past and recently stopped the patch birth control in November.  She reports she had a normal period at the end of December but since the new year has had prolonged constant vaginal bleeding.  She reports it is not overly heavy and has gone through 4 pads today.  She states is associated with some pelvic cramping.  She denies any fever, cough, chest pain, shortness of breath, bowel or urinary complaints.  No clear relieving or exacerbating factors identified.     PCP: Other, Phys, MD        Past History     Past Medical History:  History reviewed. No pertinent past medical history.    Past Surgical History:  History reviewed. No pertinent surgical history.    Family History:  History reviewed. No pertinent family history.    Social History:  Social History     Tobacco Use   ??? Smoking status: Never Smoker   ??? Smokeless tobacco: Never Used   Substance Use Topics   ??? Alcohol use: Never     Frequency: Never   ??? Drug use: Never       Allergies:  No Known Allergies      Review of Systems   Review of Systems   Constitutional: Negative for fever.   Respiratory: Negative for shortness of breath.    Cardiovascular: Negative for chest pain.   Genitourinary: Positive for pelvic pain and vaginal bleeding.   All other systems reviewed and are negative.        Physical Exam     Vitals:    11/08/19 1454 11/08/19 1546   BP: 122/82    Pulse: 81    Resp: 16    Temp: 97.7 ??F (36.5 ??C)    SpO2: 100% 100%   Weight: 63.5 kg (140 lb)    Height: '5\' 5"'$  (1.651 m)      Physical Exam    Nursing notes and vital signs reviewed     Constitutional: Non toxic appearing, moderate distress  Head: Normocephalic, Atraumatic  Eyes: EOMI  Neck: Supple  Cardiovascular: Regular rate and rhythm, no murmurs, rubs, or gallops  Chest: Normal work of breathing and chest excursion bilaterally  Lungs: Clear to ausculation bilaterally  Abdomen: Soft, non tender, non distended, normoactive bowel sounds  Pelvic: See below  Back: No evidence of trauma or deformity  Extremities: No evidence of trauma or deformity, no LE edema  Skin: Warm and dry, normal cap refill  Neuro: Alert and appropriate  Psychiatric: Normal mood and affect      Diagnostic Study Results     Labs -     Recent Results (from the past 12 hour(s))   URINALYSIS W/ RFLX MICROSCOPIC    Collection Time: 11/08/19  3:00 PM   Result Value Ref Range    Color YELLOW      Appearance CLEAR      Specific gravity 1.027 1.005 - 1.030      pH (UA) 8.0  5.0 - 8.0      Protein Negative NEG mg/dL    Glucose Negative NEG mg/dL    Ketone TRACE (A) NEG mg/dL    Bilirubin Negative NEG      Blood Negative NEG      Urobilinogen 1.0 0.2 - 1.0 EU/dL    Nitrites Negative NEG      Leukocyte Esterase TRACE (A) NEG     URINE MICROSCOPIC ONLY    Collection Time: 11/08/19  3:00 PM   Result Value Ref Range    WBC 0 to 3 0 - 5 /hpf    RBC Negative 0 - 5 /hpf    Epithelial cells 1+ 0 - 5 /lpf    Bacteria Negative NEG /hpf    Amorphous Crystals 1+ (A) NEG   CBC WITH AUTOMATED DIFF    Collection Time: 11/08/19  3:25 PM   Result Value Ref Range    WBC 4.8 4.6 - 13.2 K/uL    RBC 4.24 4.20 - 5.30 M/uL    HGB 12.4 12.0 - 16.0 g/dL    HCT 38.8 35.0 - 45.0 %    MCV 91.5 74.0 - 97.0 FL    MCH 29.2 24.0 - 34.0 PG    MCHC 32.0 31.0 - 37.0 g/dL    RDW 12.6 11.6 - 14.5 %    PLATELET 237 135 - 420 K/uL    MPV 10.8 9.2 - 11.8 FL    NEUTROPHILS 33 (L) 40 - 73 %    LYMPHOCYTES 55 (H) 21 - 52 %    MONOCYTES 6 3 - 10 %    EOSINOPHILS 6 (H) 0 - 5 %    BASOPHILS 0 0 - 2 %    ABS. NEUTROPHILS 1.6 (L) 1.8 - 8.0 K/UL     ABS. LYMPHOCYTES 2.6 0.9 - 3.6 K/UL    ABS. MONOCYTES 0.3 0.05 - 1.2 K/UL    ABS. EOSINOPHILS 0.3 0.0 - 0.4 K/UL    ABS. BASOPHILS 0.0 0.0 - 0.1 K/UL    DF AUTOMATED     METABOLIC PANEL, COMPREHENSIVE    Collection Time: 11/08/19  3:25 PM   Result Value Ref Range    Sodium 140 136 - 145 mmol/L    Potassium 4.2 3.5 - 5.5 mmol/L    Chloride 106 100 - 111 mmol/L    CO2 34 (H) 21 - 32 mmol/L    Anion gap 0 (L) 3.0 - 18 mmol/L    Glucose 106 (H) 74 - 99 mg/dL    BUN 10 7.0 - 18 MG/DL    Creatinine 0.86 0.6 - 1.3 MG/DL    BUN/Creatinine ratio 12 12 - 20      GFR est AA >60 >60 ml/min/1.66m    GFR est non-AA >60 >60 ml/min/1.735m   Calcium 8.9 8.5 - 10.1 MG/DL    Bilirubin, total 0.3 0.2 - 1.0 MG/DL    ALT (SGPT) 24 13 - 56 U/L    AST (SGOT) 13 10 - 38 U/L    Alk. phosphatase 67 45 - 117 U/L    Protein, total 8.0 6.4 - 8.2 g/dL    Albumin 3.8 3.4 - 5.0 g/dL    Globulin 4.2 (H) 2.0 - 4.0 g/dL    A-G Ratio 0.9 0.8 - 1.7     HCG URINE, QL. - POC    Collection Time: 11/08/19  3:35 PM   Result Value Ref Range    Pregnancy test,urine (POC) Negative NEG  WET PREP    Collection Time: 11/08/19  3:40 PM    Specimen: Vagina   Result Value Ref Range    Special Requests: NO SPECIAL REQUESTS      Wet prep NO YEAST,TRICHOMONAS OR CLUE CELLS NOTED         Radiologic Studies -   No orders to display     CT Results  (Last 48 hours)    None        CXR Results  (Last 48 hours)    None          Medications given in the ED-  Medications - No data to display      Medical Decision Making   I am the first provider for this patient.    I reviewed the vital signs, available nursing notes, past medical history, past surgical history, family history and social history.    Vital Signs-Reviewed the patient's vital signs.    Pulse Oximetry Analysis - 100% on room air, not hypoxic     Records Reviewed: Nursing Notes     Provider Notes (Medical Decision Making): Susan Jimenez is a 23 y.o. female presenting for prolonged vaginal bleeding.  Patient hemodynamically stable with only scant blood on exam.  No acute abnormalities identified on labs.  Plan for discharge with referral to OB/GYN for follow-up with return precautions.  Patient understands and agrees with this plan.    Procedures:  Procedures    ED Course:   3:41 PM  Exam was chaperoned by Maudie Mercury RN  Normal external genitalia.  Scant blood and physiologic discharge in the vault.   Normal cervix appearance.  On bimanual exam, no cervical, uterine, or adnexal tenderness.       Diagnosis and Disposition     Critical Care: None    DISCHARGE NOTE:    Susan Jimenez  results have been reviewed with her.  She has been counseled regarding her diagnosis, treatment, and plan.  She verbally conveys understanding and agreement of the signs, symptoms, diagnosis, treatment and prognosis and additionally agrees to follow up as discussed.  She also agrees with the care-plan and conveys that all of her questions have been answered.  I have also provided discharge instructions for her that include: educational information regarding their diagnosis and treatment, and list of reasons why they would want to return to the ED prior to their follow-up appointment, should her condition change. She has been provided with education for proper emergency department utilization.     CLINICAL IMPRESSION:    1. Abnormal vaginal bleeding        PLAN:  1. D/C Home  2. There are no discharge medications for this patient.    3.   Follow-up Information     Follow up With Specialties Details Why Contact Info    Gershon Mussel, MD Obstetrics & Gynecology, Gynecology, Obstetrics Schedule an appointment as soon as possible for a visit   [address]  [address]  [place] [address]  [phone]      [facility] EMERGENCY DEPT Emergency Medicine  If symptoms worsen [address]  [place] [address]   [phone]        _______________________________      Please note that this dictation was completed with Dragon, the computer voice recognition software.  Quite often unanticipated grammatical, syntax, homophones, and other interpretive errors are inadvertently transcribed by the computer software.  Please disregard these errors.  Please excuse any errors that  have escaped final proofreading.

## 2019-11-08 NOTE — ED Triage Notes (Signed)
Reports menstrual cycle x3 weeks. Denies heavy bleeding.

## 2019-11-12 LAB — CHLAMYDIA/NEISSERIA AMPLIFICATION
Chlamydia amplification: NEGATIVE
N. gonorrhoeae amplification: NEGATIVE

## 2019-11-12 LAB — C.TRACHOMATIS N.GONORRHOEAE DNA
Chlamydia trachomatis, NAA: NEGATIVE
Neisseria Gonorrhoeae, NAA: NEGATIVE

## 2020-01-30 ENCOUNTER — Emergency Department: Admit: 2020-01-30 | Payer: TRICARE (CHAMPUS) | Primary: Family Medicine

## 2020-01-30 ENCOUNTER — Inpatient Hospital Stay
Admit: 2020-01-30 | Discharge: 2020-01-30 | Disposition: A | Discharge status: Home / Self Care | Payer: TRICARE (CHAMPUS) | Attending: Emergency Medicine

## 2020-01-30 DIAGNOSIS — N3000 Acute cystitis without hematuria: Secondary | ICD-10-CM

## 2020-01-30 LAB — CBC WITH AUTOMATED DIFF
ABS. BASOPHILS: 0 10*3/uL (ref 0.0–0.1)
ABS. EOSINOPHILS: 0.3 10*3/uL (ref 0.0–0.4)
ABS. LYMPHOCYTES: 2 10*3/uL (ref 0.9–3.6)
ABS. MONOCYTES: 0.3 10*3/uL (ref 0.05–1.2)
ABS. NEUTROPHILS: 2.2 10*3/uL (ref 1.8–8.0)
BASOPHILS: 0 % (ref 0–2)
EOSINOPHILS: 6 % — ABNORMAL HIGH (ref 0–5)
HCT: 39.2 % (ref 35.0–45.0)
HGB: 12.5 g/dL (ref 12.0–16.0)
LYMPHOCYTES: 41 % (ref 21–52)
MCH: 29 PG (ref 24.0–34.0)
MCHC: 31.9 g/dL (ref 31.0–37.0)
MCV: 91 FL (ref 74.0–97.0)
MONOCYTES: 7 % (ref 3–10)
MPV: 10.3 FL (ref 9.2–11.8)
NEUTROPHILS: 46 % (ref 40–73)
PLATELET: 269 10*3/uL (ref 135–420)
RBC: 4.31 M/uL (ref 4.20–5.30)
RDW: 12.3 % (ref 11.6–14.5)
WBC: 4.8 10*3/uL (ref 4.6–13.2)

## 2020-01-30 LAB — METABOLIC PANEL, COMPREHENSIVE
A-G Ratio: 0.9 (ref 0.8–1.7)
ALT (SGPT): 17 U/L (ref 13–56)
AST (SGOT): 13 U/L (ref 10–38)
Albumin: 4 g/dL (ref 3.4–5.0)
Alk. phosphatase: 70 U/L (ref 45–117)
Anion gap: 2 mmol/L — ABNORMAL LOW (ref 3.0–18)
BUN/Creatinine ratio: 21 — ABNORMAL HIGH (ref 12–20)
BUN: 16 MG/DL (ref 7.0–18)
Bilirubin, total: 0.7 MG/DL (ref 0.2–1.0)
CO2: 31 mmol/L (ref 21–32)
Calcium: 8.5 MG/DL (ref 8.5–10.1)
Chloride: 105 mmol/L (ref 100–111)
Creatinine: 0.77 MG/DL (ref 0.6–1.3)
GFR est AA: >60 mL/min/{1.73_m2} (ref >60)
GFR est non-AA: >60 mL/min/{1.73_m2} (ref >60)
Globulin: 4.3 g/dL — ABNORMAL HIGH (ref 2.0–4.0)
Glucose: 87 mg/dL (ref 74–99)
Potassium: 3.7 mmol/L (ref 3.5–5.5)
Protein, total: 8.3 g/dL — ABNORMAL HIGH (ref 6.4–8.2)
Sodium: 138 mmol/L (ref 136–145)

## 2020-01-30 LAB — URINALYSIS W/ RFLX MICROSCOPIC
Bilirubin, Urine: NEGATIVE
Bilirubin: NEGATIVE
Blood, Urine: NEGATIVE
Blood: NEGATIVE
Glucose, Ur: NEGATIVE mg/dL
Glucose: NEGATIVE mg/dL
Nitrite, Urine: NEGATIVE
Nitrites: NEGATIVE
Protein, UA: NEGATIVE mg/dL
Protein: NEGATIVE mg/dL
Specific Gravity, UA: >1.03 NA — ABNORMAL HIGH (ref 1.005–1.030)
Specific gravity: >1.03 — ABNORMAL HIGH (ref 1.005–1.030)
Urobilinogen, UA, POCT: 1 EU/dL (ref 0.2–1.0)
Urobilinogen: 1 EU/dL (ref 0.2–1.0)
pH (UA): 7 (ref 5.0–8.0)
pH, UA: 7 (ref 5.0–8.0)

## 2020-01-30 LAB — URINE MICROSCOPIC ONLY
MUCUS, URINE: POSITIVE /lpf — AB
Mucus: POSITIVE /lpf — AB
RBC, UA: 0 /hpf (ref 0–5)
RBC: 0 /hpf (ref 0–5)
WBC, UA: 5 /hpf (ref 0–5)
WBC: 5 /hpf (ref 0–5)

## 2020-01-30 LAB — HCG URINE, QL. - POC
HCG, Pregnancy, Urine, POC: NEGATIVE
Pregnancy test,urine (POC): NEGATIVE

## 2020-01-30 LAB — LIPASE
Lipase: 74 U/L (ref 73–393)
Lipase: 74 U/L (ref 73–393)

## 2020-01-30 LAB — COMPREHENSIVE METABOLIC PANEL
ALT: 17 U/L (ref 13–56)
AST: 13 U/L (ref 10–38)
Albumin/Globulin Ratio: 0.9 (ref 0.8–1.7)
Albumin: 4 g/dL (ref 3.4–5.0)
Alkaline Phosphatase: 70 U/L (ref 45–117)
Anion Gap: 2 mmol/L — ABNORMAL LOW (ref 3.0–18)
BUN/Creatinine Ratio: 21 — ABNORMAL HIGH (ref 12–20)
BUN: 16 MG/DL (ref 7.0–18)
CO2: 31 mmol/L (ref 21–32)
Calcium: 8.5 MG/DL (ref 8.5–10.1)
Chloride: 105 mmol/L (ref 100–111)
Creatinine: 0.77 MG/DL (ref 0.6–1.3)
GFR African American: >60 mL/min/{1.73_m2} (ref >60)
Globulin: 4.3 g/dL — ABNORMAL HIGH (ref 2.0–4.0)
Glucose: 87 mg/dL (ref 74–99)
Potassium: 3.7 mmol/L (ref 3.5–5.5)
Sodium: 138 mmol/L (ref 136–145)
Total Bilirubin: 0.7 MG/DL (ref 0.2–1.0)
Total Protein: 8.3 g/dL — ABNORMAL HIGH (ref 6.4–8.2)
eGFR NON-AA: >60 mL/min/{1.73_m2} (ref >60)

## 2020-01-30 LAB — CBC WITH AUTO DIFFERENTIAL
Basophils %: 0 % (ref 0–2)
Basophils Absolute: 0 10*3/uL (ref 0.0–0.1)
Eosinophils %: 6 % — ABNORMAL HIGH (ref 0–5)
Eosinophils Absolute: 0.3 10*3/uL (ref 0.0–0.4)
Hematocrit: 39.2 % (ref 35.0–45.0)
Hemoglobin: 12.5 g/dL (ref 12.0–16.0)
Lymphocytes %: 41 % (ref 21–52)
Lymphocytes Absolute: 2 10*3/uL (ref 0.9–3.6)
MCH: 29 PG (ref 24.0–34.0)
MCHC: 31.9 g/dL (ref 31.0–37.0)
MCV: 91 FL (ref 74.0–97.0)
MPV: 10.3 FL (ref 9.2–11.8)
Monocytes %: 7 % (ref 3–10)
Monocytes Absolute: 0.3 10*3/uL (ref 0.05–1.2)
Neutrophils %: 46 % (ref 40–73)
Neutrophils Absolute: 2.2 10*3/uL (ref 1.8–8.0)
Platelets: 269 10*3/uL (ref 135–420)
RBC: 4.31 M/uL (ref 4.20–5.30)
RDW: 12.3 % (ref 11.6–14.5)
WBC: 4.8 10*3/uL (ref 4.6–13.2)

## 2020-01-30 MED ORDER — IBUPROFEN 600 MG TAB
600 mg | ORAL_TABLET | Freq: Four times a day (QID) | ORAL | 0 refills | Status: DC | PRN
Start: 2020-01-30 — End: 2020-06-02

## 2020-01-30 MED ORDER — CEPHALEXIN 500 MG CAP
500 mg | ORAL_CAPSULE | Freq: Two times a day (BID) | ORAL | 0 refills | Status: AC
Start: 2020-01-30 — End: 2020-02-06

## 2020-01-30 MED ORDER — METHOCARBAMOL 750 MG TAB
750 mg | ORAL_TABLET | Freq: Four times a day (QID) | ORAL | 0 refills | Status: AC
Start: 2020-01-30 — End: 2020-02-03

## 2020-01-30 MED ORDER — SODIUM CHLORIDE 0.9% BOLUS IV
0.9 % | Freq: Once | INTRAVENOUS | Status: AC
Start: 2020-01-30 — End: 2020-01-30
  Administered 2020-01-30: 15:00:00 via INTRAVENOUS

## 2020-01-30 MED FILL — SODIUM CHLORIDE 0.9 % IV: INTRAVENOUS | Qty: 1000

## 2020-01-30 NOTE — ED Provider Notes (Signed)
ED Provider Notes by Richardine Service, PA-C at 01/30/20 1025                Author: Selinda Flavin  Service: Emergency Medicine  Author Type: Physician Assistant       Filed: 01/30/20 1255  Date of Service: 01/30/20 1025  Status: Attested           Editor: Selinda Flavin (Physician Assistant)  Cosigner: Toy Cookey, DO at 01/30/20 1313          Attestation signed by Toy Cookey, DO at 01/30/20 1313          I was personally available for consultation in the emergency department.   Sallyanne Kuster Agustiady-Becker, DO                                 EMERGENCY DEPARTMENT HISTORY AND PHYSICAL EXAM      Date: 01/30/2020   Patient Name: Susan Jimenez        History of Presenting Illness          Chief Complaint       Patient presents with        ?  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 Associated sxs include urinary frequency. Pain is intermittent x 6 days. Pain is moderate in nature. No radiation  of pain. Pain worse with movement. Pt denies fever, chills, nausea, vomiting, abdominal pain, dysuria, urinary urgency, hematuria, hx of stones, and any other sxs or complaints. Pt was seen at [facility] 2 days ago for same. Pt was told possible stones  seen on [facility]. [place] but no appts available      PCP: [facility]           Past History        Past Medical History:   History reviewed. No pertinent past medical history.      Past Surgical History:   History reviewed. No pertinent surgical history.      Family History:   History reviewed. No pertinent family history.      Social History:     Social History          Tobacco Use         ?  Smoking status:  Never Smoker     ?  Smokeless tobacco:  Never Used       Substance Use  Topics         ?  Alcohol use:  Never              Frequency:  Never         ?  Drug use:  Never           Allergies:   No Known Allergies           Review of Systems     Review of Systems    Constitutional: Negative for chills and fever.    Cardiovascular: Negative for chest pain.    Gastrointestinal: Negative for abdominal pain,  nausea and vomiting.    Genitourinary: Positive for flank pain and frequency . Negative for difficulty urinating, dysuria, hematuria, urgency and vaginal bleeding.    Musculoskeletal: Positive for back pain.    All other systems reviewed and are negative.           Physical Exam          Vitals:          01/30/20 1022        BP:  129/79     Pulse:  95     Resp:  16     Temp:  98.6 ??F (37 ??C)     SpO2:  100%     Weight:  66.2 kg (146 lb)        Height:  '5\' 5"'$  (1.651 m)        Physical Exam   Vitals signs and nursing note reviewed.   Constitutional:        General: She is not in acute distress.     Appearance: She is well-developed. She is not diaphoretic.      Comments: AA female in NAD. Alert. Appears comfortable. Ambulatory. In [employer]  uniform      HENT:       Head: Normocephalic and atraumatic.      Right Ear: External ear normal.      Left Ear: External ear normal.      Nose: Nose normal.   Eyes:       General: No scleral icterus.        Right eye: No discharge.         Left eye: No discharge.      Conjunctiva/sclera: Conjunctivae normal.   Neck:       Musculoskeletal: Normal range of motion.   Cardiovascular :       Rate and Rhythm: Normal rate and regular rhythm.      Heart sounds: Normal heart sounds. No murmur. No friction rub. No gallop.     Pulmonary:       Effort: Pulmonary effort is normal. No tachypnea, accessory muscle usage or respiratory distress.      Breath sounds: Normal breath sounds . No decreased breath sounds, wheezing, rhonchi or rales.   Abdominal:      Palpations: Abdomen is soft.      Tenderness: There is  right CVA tenderness (mild). There is no left CVA  tenderness, guarding or rebound.  Negative signs include Murphy's sign and McBurney's sign.     Musculoskeletal: Normal range of motion.    Skin:      General: Skin is warm and dry.   Neurological :       Mental Status: She is alert and oriented to person, place, and time.    Psychiatric:         Behavior: Behavior normal.         Judgment: Judgment normal.                     Diagnostic Study Results        Labs -         Recent Results (from the past 12 hour(s))     URINALYSIS W/ RFLX MICROSCOPIC          Collection Time: 01/30/20 10:30 AM         Result  Value  Ref Range            Color  YELLOW  Appearance  CLEAR          Specific gravity  >1.030 (H)  1.005 - 1.030       pH (UA)  7.0  5.0 - 8.0         Protein  Negative  NEG mg/dL       Glucose  Negative  NEG mg/dL       Ketone  TRACE (A)  NEG mg/dL       Bilirubin  Negative  NEG         Blood  Negative  NEG         Urobilinogen  1.0  0.2 - 1.0 EU/dL       Nitrites  Negative  NEG         Leukocyte Esterase  SMALL (A)  NEG         URINE MICROSCOPIC ONLY          Collection Time: 01/30/20 10:30 AM         Result  Value  Ref Range            WBC  5 to 10  0 - 5 /hpf       RBC  0 to 3  0 - 5 /hpf       Epithelial cells  1+  0 - 5 /lpf       Bacteria  3+ (A)  NEG /hpf       Mucus  Positive (A)  NEG /lpf       CBC WITH AUTOMATED DIFF          Collection Time: 01/30/20 11:00 AM         Result  Value  Ref Range            WBC  4.8  4.6 - 13.2 K/uL       RBC  4.31  4.20 - 5.30 M/uL       HGB  12.5  12.0 - 16.0 g/dL       HCT  39.2  35.0 - 45.0 %       MCV  91.0  74.0 - 97.0 FL       MCH  29.0  24.0 - 34.0 PG       MCHC  31.9  31.0 - 37.0 g/dL       RDW  12.3  11.6 - 14.5 %       PLATELET  269  135 - 420 K/uL       MPV  10.3  9.2 - 11.8 FL       NEUTROPHILS  46  40 - 73 %       LYMPHOCYTES  41  21 - 52 %       MONOCYTES  7  3 - 10 %       EOSINOPHILS  6 (H)  0 - 5 %       BASOPHILS  0  0 - 2 %       ABS. NEUTROPHILS  2.2  1.8 - 8.0 K/UL       ABS. LYMPHOCYTES  2.0   0.9 - 3.6 K/UL       ABS. MONOCYTES  0.3  0.05 - 1.2 K/UL       ABS. EOSINOPHILS  0.3  0.0 - 0.4 K/UL       ABS. BASOPHILS  0.0  0.0 - 0.1 K/UL       DF  AUTOMATED  METABOLIC PANEL, COMPREHENSIVE          Collection Time: 01/30/20 11:00 AM         Result  Value  Ref Range            Sodium  138  136 - 145 mmol/L       Potassium  3.7  3.5 - 5.5 mmol/L       Chloride  105  100 - 111 mmol/L       CO2  31  21 - 32 mmol/L       Anion gap  2 (L)  3.0 - 18 mmol/L       Glucose  87  74 - 99 mg/dL       BUN  16  7.0 - 18 MG/DL       Creatinine  0.77  0.6 - 1.3 MG/DL       BUN/Creatinine ratio  21 (H)  12 - 20         GFR est AA  >60  >60 ml/min/1.65m       GFR est non-AA  >60  >60 ml/min/1.780m      Calcium  8.5  8.5 - 10.1 MG/DL       Bilirubin, total  0.7  0.2 - 1.0 MG/DL       ALT (SGPT)  17  13 - 56 U/L       AST (SGOT)  13  10 - 38 U/L       Alk. phosphatase  70  45 - 117 U/L       Protein, total  8.3 (H)  6.4 - 8.2 g/dL       Albumin  4.0  3.4 - 5.0 g/dL       Globulin  4.3 (H)  2.0 - 4.0 g/dL       A-G Ratio  0.9  0.8 - 1.7         LIPASE          Collection Time: 01/30/20 11:00 AM         Result  Value  Ref Range            Lipase  74  73 - 393 U/L       HCG URINE, QL. - POC          Collection Time: 01/30/20 11:09 AM         Result  Value  Ref Range            Pregnancy test,urine (POC)  Negative  NEG                  CT ABD PELV WO CONT       Final Result          No acute findings or discrete CT explanation for reported symptoms.                      CT Results   (Last 48 hours)                                    01/30/20 1155    CT ABD PELV WO CONT  Final result            Impression:           No acute findings or discrete CT explanation for reported symptoms.  Narrative:    EXAM: CT of the abdomen and pelvis             INDICATION: History of right flank pain.             COMPARISON: None.             TECHNIQUE: Axial CT imaging of the abdomen and pelvis was performed  without      intravenous contrast. Multiplanar reformats were generated. One or more dose      reduction techniques were used on this CT: automated exposure control,      adjustment of the mAs and/or kVp according to patient size, and iterative      reconstruction techniques.  The specific techniques used on this CT exam have      been documented in the patient's electronic medical record.  Digital Imaging and      Communications in Medicine (DICOM) format image data are available to      nonaffiliated external healthcare facilities or entities on a secure, media      free, reciprocally searchable basis with patient authorization for at least a      31-monthperiod after this study.             _______________             FINDINGS:             LOWER CHEST: Unremarkable.             LIVER, BILIARY: Liver is normal. No biliary dilation. Gallbladder is      unremarkable.             PANCREAS: Normal.             SPLEEN: Normal.             ADRENALS: Normal.             KIDNEYS/URETERS/BLADDER: Normal.             PELVIC ORGANS: Unremarkable.             VASCULATURE: Unremarkable             LYMPH NODES: No enlarged lymph nodes.             GASTROINTESTINAL TRACT: No bowel dilation or wall thickening. Normal appendix.             BONES: No acute or aggressive osseous abnormalities identified.             OTHER: None.             _______________                                 CXR Results   (Last 48 hours)          None                  Medications given in the ED-     Medications       sodium chloride 0.9 % bolus infusion 1,000 mL (1,000 mL IntraVENous New Bag 01/30/20 1101)                Medical Decision Making     I am the first provider for this patient.      I reviewed the vital signs, available nursing notes, past medical history, past surgical history, family history and social history.  Vital Signs-Reviewed the patient's vital signs.      Pulse Oximetry Analysis - 100% on RA. NORMAL       Records Reviewed:  Nursing Notes and Old Medical Records      Provider Notes (Medical Decision Making): UTI/pyelo, stone, GB, MSK, zoster. Doubt cardiac or PE      Procedures:   Procedures      ED Course:    10:25 AM Initial assessment performed. The patients presenting problems have been discussed, and they are in agreement with the care plan formulated and outlined with them.  I have encouraged them to ask questions as they arise throughout their visit.        Diagnosis and Disposition           Afebrile. Labs reassuring. CT unremarkable for stone. UA c/w UTI. Will tx with ABX and await urine culture. Doubt upper urinary tract involvement. Possible MSK etiology as back pain worse with movement. No low back alarm sxs. Ambulatory. Reasons to RTED  discussed with pt. All questions answered. Pt feels comfortable going home at this time. Pt expressed understanding and she agrees with plan.               1.  Acute cystitis without hematuria         2.  Acute right flank pain            PLAN:   1. D/C Home   2.      Current Discharge Medication List              START taking these medications          Details        cephALEXin (Keflex) 500 mg capsule  Take 1 Cap by mouth two (2) times a day for 7 days. Indications: bacterial urinary tract infection   Qty: 14 Cap, Refills:  0               methocarbamoL (Robaxin-750) 750 mg tablet  Take 1 Tab by mouth four (4) times daily for 4 days. Indications: muscle spasm   Qty: 16 Tab, Refills:  0               ibuprofen (MOTRIN) 600 mg tablet  Take 1 Tab by mouth every six (6) hours as needed for Pain. Take with food.   Qty: 20 Tab, Refills:  0                      3.      Follow-up Information               Follow up With  Specialties  Details  Why  Contact Info              [facility]        [phone]              [facility] EMERGENCY DEPT  Emergency Medicine      [place]   [phone]             _______________________________      Attestations:   This note is  prepared by Dionne Bucy, PA-C.   _______________________________         Please note that this dictation was completed with Dragon, the computer voice recognition software.  Quite often unanticipated grammatical, syntax, homophones, and other interpretive errors are  inadvertently transcribed by the computer software.  Please disregard these errors.  Please excuse any errors that have escaped final proofreading.

## 2020-01-30 NOTE — ED Notes (Signed)
Pt to ED for right flank pain for last 6d, pt denies N/V/D, dysuria, hematuria, but does report mild increased in urinary frequency.

## 2020-01-30 NOTE — ED Notes (Signed)
I have reviewed discharge instructions with the patient.  The patient verbalized understanding.

## 2020-01-30 NOTE — ED Triage Notes (Signed)
Pt to ED for right flank pain for last 6d, pt denies N/V/D, dysuria, hematuria, but does report mild increased in urinary frequency.

## 2020-01-30 NOTE — ED Provider Notes (Signed)
EMERGENCY DEPARTMENT HISTORY AND PHYSICAL EXAM    Date: 01/30/2020  Patient Name: Susan Jimenez    History of Presenting Illness     Chief Complaint   Patient presents with   ??? 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 Associated sxs include urinary frequency. Pain is intermittent x 6 days. Pain is moderate in nature. No radiation of pain. Pain worse with movement. Pt denies fever, chills, nausea, vomiting, abdominal pain, dysuria, urinary urgency, hematuria, hx of stones, and any other sxs or complaints. Pt was seen at [facility] 2 days ago for same. Pt was told possible stones seen on [facility]. [place] but no appts available    PCP: [facility]      Past History     Past Medical History:  History reviewed. No pertinent past medical history.    Past Surgical History:  History reviewed. No pertinent surgical history.    Family History:  History reviewed. No pertinent family history.    Social History:  Social History     Tobacco Use   ??? Smoking status: Never Smoker   ??? Smokeless tobacco: Never Used   Substance Use Topics   ??? Alcohol use: Never     Frequency: Never   ??? Drug use: Never       Allergies:  No Known Allergies      Review of Systems   Review of Systems   Constitutional: Negative for chills and fever.   Cardiovascular: Negative for chest pain.   Gastrointestinal: Negative for abdominal pain, nausea and vomiting.   Genitourinary: Positive for flank pain and frequency. Negative for difficulty urinating, dysuria, hematuria, urgency and vaginal bleeding.   Musculoskeletal: Positive for back pain.   All other systems reviewed and are negative.      Physical Exam     Vitals:    01/30/20 1022   BP: 129/79   Pulse: 95   Resp: 16   Temp: 98.6 ??F (37 ??C)   SpO2: 100%   Weight: 66.2 kg (146 lb)    Height: 5' 5" (1.651 m)     Physical Exam  Vitals signs and nursing note reviewed.   Constitutional:       General: She is not in acute distress.     Appearance: She is well-developed. She is not diaphoretic.      Comments: AA female in NAD. Alert. Appears comfortable. Ambulatory. In [employer] uniform     HENT:      Head: Normocephalic and atraumatic.      Right Ear: External ear normal.      Left Ear: External ear normal.      Nose: Nose normal.   Eyes:      General: No scleral icterus.        Right eye: No discharge.         Left eye: No discharge.      Conjunctiva/sclera: Conjunctivae normal.   Neck:      Musculoskeletal: Normal range of motion.   Cardiovascular:      Rate and Rhythm: Normal rate and regular rhythm.      Heart sounds: Normal heart sounds. No murmur. No friction rub. No  gallop.    Pulmonary:      Effort: Pulmonary effort is normal. No tachypnea, accessory muscle usage or respiratory distress.      Breath sounds: Normal breath sounds. No decreased breath sounds, wheezing, rhonchi or rales.   Abdominal:      Palpations: Abdomen is soft.      Tenderness: There is right CVA tenderness (mild). There is no left CVA tenderness, guarding or rebound. Negative signs include Murphy's sign and McBurney's sign.   Musculoskeletal: Normal range of motion.   Skin:     General: Skin is warm and dry.   Neurological:      Mental Status: She is alert and oriented to person, place, and time.   Psychiatric:         Behavior: Behavior normal.         Judgment: Judgment normal.             Diagnostic Study Results     Labs -     Recent Results (from the past 12 hour(s))   URINALYSIS W/ RFLX MICROSCOPIC    Collection Time: 01/30/20 10:30 AM   Result Value Ref Range    Color YELLOW      Appearance CLEAR      Specific gravity >1.030 (H) 1.005 - 1.030    pH (UA) 7.0 5.0 - 8.0      Protein Negative NEG mg/dL    Glucose Negative NEG mg/dL    Ketone TRACE (A) NEG mg/dL    Bilirubin Negative NEG      Blood Negative NEG       Urobilinogen 1.0 0.2 - 1.0 EU/dL    Nitrites Negative NEG      Leukocyte Esterase SMALL (A) NEG     URINE MICROSCOPIC ONLY    Collection Time: 01/30/20 10:30 AM   Result Value Ref Range    WBC 5 to 10 0 - 5 /hpf    RBC 0 to 3 0 - 5 /hpf    Epithelial cells 1+ 0 - 5 /lpf    Bacteria 3+ (A) NEG /hpf    Mucus Positive (A) NEG /lpf   CBC WITH AUTOMATED DIFF    Collection Time: 01/30/20 11:00 AM   Result Value Ref Range    WBC 4.8 4.6 - 13.2 K/uL    RBC 4.31 4.20 - 5.30 M/uL    HGB 12.5 12.0 - 16.0 g/dL    HCT 39.2 35.0 - 45.0 %    MCV 91.0 74.0 - 97.0 FL    MCH 29.0 24.0 - 34.0 PG    MCHC 31.9 31.0 - 37.0 g/dL    RDW 12.3 11.6 - 14.5 %    PLATELET 269 135 - 420 K/uL    MPV 10.3 9.2 - 11.8 FL    NEUTROPHILS 46 40 - 73 %    LYMPHOCYTES 41 21 - 52 %    MONOCYTES 7 3 - 10 %    EOSINOPHILS 6 (H) 0 - 5 %    BASOPHILS 0 0 - 2 %    ABS. NEUTROPHILS 2.2 1.8 - 8.0 K/UL    ABS. LYMPHOCYTES 2.0 0.9 - 3.6 K/UL    ABS. MONOCYTES 0.3 0.05 - 1.2 K/UL    ABS. EOSINOPHILS 0.3 0.0 - 0.4 K/UL    ABS. BASOPHILS 0.0 0.0 - 0.1 K/UL    DF AUTOMATED     METABOLIC PANEL, COMPREHENSIVE    Collection Time: 01/30/20 11:00 AM   Result Value Ref Range  Sodium 138 136 - 145 mmol/L    Potassium 3.7 3.5 - 5.5 mmol/L    Chloride 105 100 - 111 mmol/L    CO2 31 21 - 32 mmol/L    Anion gap 2 (L) 3.0 - 18 mmol/L    Glucose 87 74 - 99 mg/dL    BUN 16 7.0 - 18 MG/DL    Creatinine 0.77 0.6 - 1.3 MG/DL    BUN/Creatinine ratio 21 (H) 12 - 20      GFR est AA >60 >60 ml/min/1.23m    GFR est non-AA >60 >60 ml/min/1.759m   Calcium 8.5 8.5 - 10.1 MG/DL    Bilirubin, total 0.7 0.2 - 1.0 MG/DL    ALT (SGPT) 17 13 - 56 U/L    AST (SGOT) 13 10 - 38 U/L    Alk. phosphatase 70 45 - 117 U/L    Protein, total 8.3 (H) 6.4 - 8.2 g/dL    Albumin 4.0 3.4 - 5.0 g/dL    Globulin 4.3 (H) 2.0 - 4.0 g/dL    A-G Ratio 0.9 0.8 - 1.7     LIPASE    Collection Time: 01/30/20 11:00 AM   Result Value Ref Range    Lipase 74 73 - 393 U/L   HCG URINE, QL. - POC    Collection Time: 01/30/20  11:09 AM   Result Value Ref Range    Pregnancy test,urine (POC) Negative NEG           CT ABD PELV WO CONT   Final Result      No acute findings or discrete CT explanation for reported symptoms.           CT Results  (Last 48 hours)               01/30/20 1155  CT ABD PELV WO CONT Final result    Impression:      No acute findings or discrete CT explanation for reported symptoms.           Narrative:  EXAM: CT of the abdomen and pelvis       INDICATION: History of right flank pain.       COMPARISON: None.       TECHNIQUE: Axial CT imaging of the abdomen and pelvis was performed without   intravenous contrast. Multiplanar reformats were generated. One or more dose   reduction techniques were used on this CT: automated exposure control,   adjustment of the mAs and/or kVp according to patient size, and iterative   reconstruction techniques.  The specific techniques used on this CT exam have   been documented in the patient's electronic medical record.  Digital Imaging and   Communications in Medicine (DICOM) format image data are available to   nonaffiliated external healthcare facilities or entities on a secure, media   free, reciprocally searchable basis with patient authorization for at least a   1268-monthriod after this study.       _______________       FINDINGS:       LOWER CHEST: Unremarkable.       LIVER, BILIARY: Liver is normal. No biliary dilation. Gallbladder is   unremarkable.       PANCREAS: Normal.       SPLEEN: Normal.       ADRENALS: Normal.       KIDNEYS/URETERS/BLADDER: Normal.       PELVIC ORGANS: Unremarkable.       VASCULATURE: Unremarkable  LYMPH NODES: No enlarged lymph nodes.       GASTROINTESTINAL TRACT: No bowel dilation or wall thickening. Normal appendix.       BONES: No acute or aggressive osseous abnormalities identified.       OTHER: None.       _______________               CXR Results  (Last 48 hours)    None          Medications given in the ED-  Medications   sodium chloride  0.9 % bolus infusion 1,000 mL (1,000 mL IntraVENous New Bag 01/30/20 1101)         Medical Decision Making   I am the first provider for this patient.    I reviewed the vital signs, available nursing notes, past medical history, past surgical history, family history and social history.    Vital Signs-Reviewed the patient's vital signs.    Pulse Oximetry Analysis - 100% on RA. NORMAL     Records Reviewed: Nursing Notes and Old Medical Records    Provider Notes (Medical Decision Making): UTI/pyelo, stone, GB, MSK, zoster. Doubt cardiac or PE    Procedures:  Procedures    ED Course:   10:25 AM Initial assessment performed. The patients presenting problems have been discussed, and they are in agreement with the care plan formulated and outlined with them.  I have encouraged them to ask questions as they arise throughout their visit.    Diagnosis and Disposition       Afebrile. Labs reassuring. CT unremarkable for stone. UA c/w UTI. Will tx with ABX and await urine culture. Doubt upper urinary tract involvement. Possible MSK etiology as back pain worse with movement. No low back alarm sxs. Ambulatory. Reasons to RTED discussed with pt. All questions answered. Pt feels comfortable going home at this time. Pt expressed understanding and she agrees with plan.        1. Acute cystitis without hematuria    2. Acute right flank pain        PLAN:  1. D/C Home  2.   Current Discharge Medication List      START taking these medications    Details   cephALEXin (Keflex) 500 mg capsule Take 1 Cap by mouth two (2) times a day for 7 days. Indications: bacterial urinary tract infection  Qty: 14 Cap, Refills: 0      methocarbamoL (Robaxin-750) 750 mg tablet Take 1 Tab by mouth four (4) times daily for 4 days. Indications: muscle spasm  Qty: 16 Tab, Refills: 0      ibuprofen (MOTRIN) 600 mg tablet Take 1 Tab by mouth every six (6) hours as needed for Pain. Take with food.  Qty: 20 Tab, Refills: 0           3.   Follow-up Information      Follow up With Specialties Details Why Contact Info    [facility]    [phone]    [facility] EMERGENCY DEPT Emergency Medicine   [place]  [phone]        _______________________________    Attestations:  This note is prepared by Dionne Bucy, PA-C.  _______________________________      Please note that this dictation was completed with Dragon, the computer voice recognition software.  Quite often unanticipated grammatical, syntax, homophones, and other interpretive errors are inadvertently transcribed by the computer software.  Please disregard these errors.  Please excuse  any errors that have escaped final proofreading.

## 2020-02-01 LAB — CULTURE, URINE
Culture result:: NO GROWTH
Culture: NO GROWTH

## 2020-02-27 DIAGNOSIS — S90425A Blister (nonthermal), left lesser toe(s), initial encounter: Secondary | ICD-10-CM

## 2020-02-27 NOTE — ED Notes (Signed)
Patient arrives from home c/o LEFT great toe pain x1 week. Reports she got new boots and it rubbed a blister. Unable to visualize in triage. Ambulatory with steady gait. AOX4.

## 2020-02-27 NOTE — ED Notes (Signed)
I have reviewed discharge instructions with the patient.  The patient verbalized understanding.  Patient armband removed and shredded

## 2020-02-27 NOTE — ED Provider Notes (Signed)
EMERGENCY DEPARTMENT HISTORY AND PHYSICAL EXAM    Date: 02/27/2020  Patient Name: Susan Jimenez    History of Presenting Illness     Chief Complaint   Patient presents with   ??? Toe Pain         History Provided By: Patient    Susan Jimenez is a 24 y.o. female who presents to the emergency department C/O left great toe pain.  Patient states it has been worsening for about a week.  States she is wearing new boots and feels like it rubbed against the creating a blister.  She states today when she was pushing on the blister it popped and drained some yellow-greenish material.  No other complaints.         PCP: Other, Phys, MD    Current Outpatient Medications   Medication Sig Dispense Refill   ??? mupirocin (BACTROBAN) 2 % ointment Apply  to affected area daily. 22 g 0   ??? cephALEXin (Keflex) 500 mg capsule Take 1 Cap by mouth four (4) times daily for 5 days. 20 Cap 0   ??? diclofenac potassium (CATAFLAM) 50 mg tablet Take 1 Tab by mouth three (3) times daily. 20 Tab 0   ??? ibuprofen (MOTRIN) 600 mg tablet Take 1 Tab by mouth every six (6) hours as needed for Pain. Take with food. 20 Tab 0       Past History     Past Medical History:  History reviewed. No pertinent past medical history.    Past Surgical History:  No past surgical history on file.    Family History:  History reviewed. No pertinent family history.    Social History:  Social History     Tobacco Use   ??? Smoking status: Never Smoker   ??? Smokeless tobacco: Never Used   Substance Use Topics   ??? Alcohol use: Never     Frequency: Never   ??? Drug use: Never       Allergies:  No Known Allergies      Review of Systems   Review of Systems   Constitutional: Negative for fever.   Gastrointestinal: Negative for abdominal pain.   Musculoskeletal: Positive for joint swelling. Negative for myalgias.   Skin: Positive for wound.        Left great toe swelling   All other systems reviewed and are negative.    All other systems reviewed and are negative.    Physical Exam      Vitals:    02/27/20 2239   BP: 128/69   Pulse: 66   Resp: 16   Temp: 97.3 ??F (36.3 ??C)   SpO2: 100%   Weight: 67.1 kg (148 lb)   Height: 5\' 5"  (1.651 m)     Physical Exam    Nursing notes and vital signs reviewed    Airway: intact, speaking normally  Breathing: No apparent distress, no cyanosis  Circulation: Peripheral pulses equal    Constitutional: Non toxic appearing, no acute distress, appearing stated age  Head: Normocephalic, Atraumatic  Eyes: PERRL, EOMI, No conjunctival injection  Ears: external ears normal  Nose: No rhinorrhea, external nose normal  Throat: mucous membranes moist  Neck: symmetric, trachea midline, no obvious swelling, no JVD, no obvious deformity  Musculoskeletal:  No evidence of obvious deformity to the back, extremities, no LE edema  Skin: Warm, dry, there is what appears to be a small paronychia/blister over the left lateral great toe nail edge.  There is no significant  fluctuance at this time.  There is mild surrounding erythema.  Is tender to touch.  Neuro: Alert and oriented x 3, CN 2-12 intact, normal speech, strength and sensation full and symmetric bilaterally  Psychiatric: Normal mood and affect      Diagnostic Study Results     Labs -   No results found for this or any previous visit (from the past 72 hour(s)).    Radiologic Studies -   No orders to display     CT Results  (Last 48 hours)    None        CXR Results  (Last 48 hours)    None          Medications given in the ED-  Medications - No data to display      Medical Decision Making     I reviewed the vital signs, available nursing notes, past medical history, past surgical history, family history and social history.    Vital Signs interpretation- I have reviewed the patient's vital signs.    Pulse Oximetry interpretation - 100% on Room air     Cardiac Monitor interpretation:  Rate: 66 bpm  Rhythm: sinus    Records Reviewed: Nursing Notes and Old Medical Records    Procedures:  Procedures    ED Course & MDM:   At this  time it appears the patient likely developed a paronychia that spontaneously drained on its own although there is still some mild soft tissue swelling.  I discussed with the patient that use of warm compresses as well as topical and oral antibiotics and follow-up with a podiatrist.  Recommended come back to the emergency department if symptoms worsen.  She understands and agrees to plan    Diagnosis and Disposition       DISCHARGE NOTE:    Susan Jimenez's  results have been reviewed with her.  She has been counseled regarding her diagnosis, treatment, and plan.  She verbally conveys understanding and agreement of the signs, symptoms, diagnosis, treatment and prognosis and additionally agrees to follow up as discussed.  She also agrees with the care-plan and conveys that all of her questions have been answered.  I have also provided discharge instructions for her that include: educational information regarding their diagnosis and treatment, and list of reasons why they would want to return to the ED prior to their follow-up appointment, should her condition change. She has been provided with education for proper emergency department utilization.     CLINICAL IMPRESSION:    1. Blister of toe of left foot, initial encounter        PLAN:  1. D/C Home  2.   Current Discharge Medication List      START taking these medications    Details   mupirocin (BACTROBAN) 2 % ointment Apply  to affected area daily.  Qty: 22 g, Refills: 0  Start date: 02/27/2020      cephALEXin (Keflex) 500 mg capsule Take 1 Cap by mouth four (4) times daily for 5 days.  Qty: 20 Cap, Refills: 0  Start date: 02/27/2020, End date: 03/03/2020      diclofenac potassium (CATAFLAM) 50 mg tablet Take 1 Tab by mouth three (3) times daily.  Qty: 20 Tab, Refills: 0  Start date: 02/27/2020           3.   Follow-up Information     Follow up With Specialties Details Why Contact Info    Hopson, Sheral Apley, DPM Podiatry  Schedule an appointment as soon as possible for a  visit  for podiatrist (foot specialist) [address]  [address]  [place] [address]  [phone]      [facility] EMERGENCY DEPT Emergency Medicine Go to  If symptoms worsen [address]  [place] [address]  [phone]        _______________________________      Please note that this dictation was completed with Dragon, the computer voice recognition software.  Quite often unanticipated grammatical, syntax, homophones, and other interpretive errors are inadvertently transcribed by the computer software.  Please disregard these errors.  Please excuse any errors that have escaped final proofreading.

## 2020-02-27 NOTE — ED Triage Notes (Signed)
Patient arrives from home c/o LEFT great toe pain x1 week. Reports she got new boots and it rubbed a blister. Unable to visualize in triage. Ambulatory with steady gait. AOX4.

## 2020-02-27 NOTE — ED Notes (Signed)
I have reviewed discharge instructions with the patient.  The patient verbalized understanding.        Patient armband removed and shredded

## 2020-02-27 NOTE — ED Provider Notes (Signed)
EMERGENCY DEPARTMENT HISTORY AND PHYSICAL EXAM    Date: 02/27/2020  Patient Name: Susan Jimenez    History of Presenting Illness     Chief Complaint   Patient presents with   ??? Toe Pain         History Provided By: Patient    Susan Jimenez is a 24 y.o. female who presents to the emergency department C/O left great toe pain.  Patient states it has been worsening for about a week.  States she is wearing new boots and feels like it rubbed against the creating a blister.  She states today when she was pushing on the blister it popped and drained some yellow-greenish material.  No other complaints.         PCP: Other, Phys, MD    Current Outpatient Medications   Medication Sig Dispense Refill   ??? mupirocin (BACTROBAN) 2 % ointment Apply  to affected area daily. 22 g 0   ??? cephALEXin (Keflex) 500 mg capsule Take 1 Cap by mouth four (4) times daily for 5 days. 20 Cap 0   ??? diclofenac potassium (CATAFLAM) 50 mg tablet Take 1 Tab by mouth three (3) times daily. 20 Tab 0   ??? ibuprofen (MOTRIN) 600 mg tablet Take 1 Tab by mouth every six (6) hours as needed for Pain. Take with food. 20 Tab 0       Past History     Past Medical History:  History reviewed. No pertinent past medical history.    Past Surgical History:  No past surgical history on file.    Family History:  History reviewed. No pertinent family history.    Social History:  Social History     Tobacco Use   ??? Smoking status: Never Smoker   ??? Smokeless tobacco: Never Used   Substance Use Topics   ??? Alcohol use: Never     Frequency: Never   ??? Drug use: Never       Allergies:  No Known Allergies      Review of Systems   Review of Systems   Constitutional: Negative for fever.   Gastrointestinal: Negative for abdominal pain.   Musculoskeletal: Positive for joint swelling. Negative for myalgias.   Skin: Positive for wound.        Left great toe swelling   All other systems reviewed and are negative.    All other systems reviewed and are negative.    Physical Exam      Vitals:    02/27/20 2239   BP: 128/69   Pulse: 66   Resp: 16   Temp: 97.3 ??F (36.3 ??C)   SpO2: 100%   Weight: 67.1 kg (148 lb)   Height: 5\' 5"  (1.651 m)     Physical Exam    Nursing notes and vital signs reviewed    Airway: intact, speaking normally  Breathing: No apparent distress, no cyanosis  Circulation: Peripheral pulses equal    Constitutional: Non toxic appearing, no acute distress, appearing stated age  Head: Normocephalic, Atraumatic  Eyes: PERRL, EOMI, No conjunctival injection  Ears: external ears normal  Nose: No rhinorrhea, external nose normal  Throat: mucous membranes moist  Neck: symmetric, trachea midline, no obvious swelling, no JVD, no obvious deformity  Musculoskeletal:  No evidence of obvious deformity to the back, extremities, no LE edema  Skin: Warm, dry, there is what appears to be a small paronychia/blister over the left lateral great toe nail edge.  There is no significant  fluctuance at this time.  There is mild surrounding erythema.  Is tender to touch.  Neuro: Alert and oriented x 3, CN 2-12 intact, normal speech, strength and sensation full and symmetric bilaterally  Psychiatric: Normal mood and affect      Diagnostic Study Results     Labs -   No results found for this or any previous visit (from the past 72 hour(s)).    Radiologic Studies -   No orders to display     CT Results  (Last 48 hours)    None        CXR Results  (Last 48 hours)    None          Medications given in the ED-  Medications - No data to display      Medical Decision Making     I reviewed the vital signs, available nursing notes, past medical history, past surgical history, family history and social history.    Vital Signs interpretation- I have reviewed the patient's vital signs.    Pulse Oximetry interpretation - 100% on Room air     Cardiac Monitor interpretation:  Rate: 66 bpm  Rhythm: sinus    Records Reviewed: Nursing Notes and Old Medical Records    Procedures:  Procedures    ED Course & MDM:   At this time  it appears the patient likely developed a paronychia that spontaneously drained on its own although there is still some mild soft tissue swelling.  I discussed with the patient that use of warm compresses as well as topical and oral antibiotics and follow-up with a podiatrist.  Recommended come back to the emergency department if symptoms worsen.  She understands and agrees to plan    Diagnosis and Disposition       DISCHARGE NOTE:    Susan Jimenez  results have been reviewed with her.  She has been counseled regarding her diagnosis, treatment, and plan.  She verbally conveys understanding and agreement of the signs, symptoms, diagnosis, treatment and prognosis and additionally agrees to follow up as discussed.  She also agrees with the care-plan and conveys that all of her questions have been answered.  I have also provided discharge instructions for her that include: educational information regarding their diagnosis and treatment, and list of reasons why they would want to return to the ED prior to their follow-up appointment, should her condition change. She has been provided with education for proper emergency department utilization.     CLINICAL IMPRESSION:    1. Blister of toe of left foot, initial encounter        PLAN:  1. D/C Home  2.   Current Discharge Medication List      START taking these medications    Details   mupirocin (BACTROBAN) 2 % ointment Apply  to affected area daily.  Qty: 22 g, Refills: 0  Start date: 02/27/2020      cephALEXin (Keflex) 500 mg capsule Take 1 Cap by mouth four (4) times daily for 5 days.  Qty: 20 Cap, Refills: 0  Start date: 02/27/2020, End date: 03/03/2020      diclofenac potassium (CATAFLAM) 50 mg tablet Take 1 Tab by mouth three (3) times daily.  Qty: 20 Tab, Refills: 0  Start date: 02/27/2020           3.   Follow-up Information     Follow up With Specialties Details Why Contact Info    Hopson, Sheral Apley, DPM Podiatry  Schedule an appointment as soon as possible for a  visit  for podiatrist (foot specialist) [address]  [address]  [place] [address]  [phone]      [facility] EMERGENCY DEPT Emergency Medicine Go to  If symptoms worsen [address]  [place] [address]  [phone]        _______________________________      Please note that this dictation was completed with Dragon, the computer voice recognition software.  Quite often unanticipated grammatical, syntax, homophones, and other interpretive errors are inadvertently transcribed by the computer software.  Please disregard these errors.  Please excuse any errors that have escaped final proofreading.

## 2020-02-28 ENCOUNTER — Inpatient Hospital Stay
Admit: 2020-02-28 | Discharge: 2020-02-28 | Disposition: A | Discharge status: Home / Self Care | Payer: TRICARE (CHAMPUS) | Attending: Emergency Medicine

## 2020-02-28 MED ORDER — DICLOFENAC 50 MG TAB
50 mg | ORAL_TABLET | Freq: Three times a day (TID) | ORAL | 0 refills | Status: DC
Start: 2020-02-28 — End: 2020-06-02

## 2020-02-28 MED ORDER — MUPIROCIN 2 % OINTMENT
2 % | Freq: Every day | CUTANEOUS | 0 refills | Status: DC
Start: 2020-02-28 — End: 2020-06-02

## 2020-02-28 MED ORDER — CEPHALEXIN 500 MG CAP
500 mg | ORAL_CAPSULE | Freq: Four times a day (QID) | ORAL | 0 refills | Status: AC
Start: 2020-02-28 — End: 2020-03-03

## 2020-06-02 ENCOUNTER — Emergency Department: Payer: TRICARE (CHAMPUS) | Primary: Family Medicine

## 2020-06-02 ENCOUNTER — Inpatient Hospital Stay
Admit: 2020-06-02 | Discharge: 2020-06-03 | Disposition: A | Discharge status: Home / Self Care | Payer: TRICARE (CHAMPUS) | Attending: Emergency Medicine

## 2020-06-02 DIAGNOSIS — R519 Headache, unspecified: Secondary | ICD-10-CM

## 2020-06-02 LAB — URINALYSIS W/ RFLX MICROSCOPIC
Bilirubin, Urine: NEGATIVE
Bilirubin: NEGATIVE
Blood, Urine: NEGATIVE
Blood: NEGATIVE
Glucose, Ur: NEGATIVE mg/dL
Glucose: NEGATIVE mg/dL
Ketone: NEGATIVE mg/dL
Ketones, Urine: NEGATIVE mg/dL
Leukocyte Esterase, Urine: NEGATIVE
Leukocyte Esterase: NEGATIVE
Nitrite, Urine: NEGATIVE
Nitrites: NEGATIVE
Protein, UA: NEGATIVE mg/dL
Protein: NEGATIVE mg/dL
Specific Gravity, UA: 1.027 (ref 1.005–1.030)
Specific gravity: 1.027 (ref 1.005–1.030)
Urobilinogen, UA, POCT: 1 EU/dL (ref 0.2–1.0)
Urobilinogen: 1 EU/dL (ref 0.2–1.0)
pH (UA): 8 (ref 5.0–8.0)
pH, UA: 8 (ref 5.0–8.0)

## 2020-06-02 LAB — CBC WITH AUTOMATED DIFF
ABS. BASOPHILS: 0 10*3/uL (ref 0.0–0.1)
ABS. EOSINOPHILS: 0.3 10*3/uL (ref 0.0–0.4)
ABS. LYMPHOCYTES: 2.9 10*3/uL (ref 0.9–3.6)
ABS. MONOCYTES: 0.3 10*3/uL (ref 0.05–1.2)
ABS. NEUTROPHILS: 1.9 10*3/uL (ref 1.8–8.0)
BASOPHILS: 1 % (ref 0–2)
EOSINOPHILS: 6 % — ABNORMAL HIGH (ref 0–5)
HCT: 40.1 % (ref 35.0–45.0)
HGB: 12.8 g/dL (ref 12.0–16.0)
LYMPHOCYTES: 54 % — ABNORMAL HIGH (ref 21–52)
MCH: 28.3 PG (ref 24.0–34.0)
MCHC: 31.9 g/dL (ref 31.0–37.0)
MCV: 88.7 FL (ref 74.0–97.0)
MONOCYTES: 6 % (ref 3–10)
MPV: 10.8 FL (ref 9.2–11.8)
NEUTROPHILS: 34 % — ABNORMAL LOW (ref 40–73)
PLATELET: 220 10*3/uL (ref 135–420)
RBC: 4.52 M/uL (ref 4.20–5.30)
RDW: 12.9 % (ref 11.6–14.5)
WBC: 5.5 10*3/uL (ref 4.6–13.2)

## 2020-06-02 LAB — METABOLIC PANEL, COMPREHENSIVE
A-G Ratio: 0.9 (ref 0.8–1.7)
ALT (SGPT): 20 U/L (ref 13–56)
AST (SGOT): 15 U/L (ref 10–38)
Albumin: 4.1 g/dL (ref 3.4–5.0)
Alk. phosphatase: 75 U/L (ref 45–117)
Anion gap: 0 mmol/L — ABNORMAL LOW (ref 3.0–18)
BUN/Creatinine ratio: 20 (ref 12–20)
BUN: 15 MG/DL (ref 7.0–18)
Bilirubin, total: 0.3 MG/DL (ref 0.2–1.0)
CO2: 30 mmol/L (ref 21–32)
Calcium: 8.9 MG/DL (ref 8.5–10.1)
Chloride: 109 mmol/L (ref 100–111)
Creatinine: 0.74 MG/DL (ref 0.6–1.3)
GFR est AA: >60 mL/min/{1.73_m2} (ref >60)
GFR est non-AA: >60 mL/min/{1.73_m2} (ref >60)
Globulin: 4.4 g/dL — ABNORMAL HIGH (ref 2.0–4.0)
Glucose: 99 mg/dL (ref 74–99)
Potassium: 4.1 mmol/L (ref 3.5–5.5)
Protein, total: 8.5 g/dL — ABNORMAL HIGH (ref 6.4–8.2)
Sodium: 139 mmol/L (ref 136–145)

## 2020-06-02 LAB — LIPASE
Lipase: 131 U/L (ref 73–393)
Lipase: 131 U/L (ref 73–393)

## 2020-06-02 LAB — HCG URINE, QL. - POC
HCG, Pregnancy, Urine, POC: NEGATIVE
Pregnancy test,urine (POC): NEGATIVE

## 2020-06-02 LAB — COMPREHENSIVE METABOLIC PANEL
ALT: 20 U/L (ref 13–56)
AST: 15 U/L (ref 10–38)
Albumin/Globulin Ratio: 0.9 (ref 0.8–1.7)
Albumin: 4.1 g/dL (ref 3.4–5.0)
Alkaline Phosphatase: 75 U/L (ref 45–117)
Anion Gap: 0 mmol/L — ABNORMAL LOW (ref 3.0–18)
BUN/Creatinine Ratio: 20 (ref 12–20)
BUN: 15 MG/DL (ref 7.0–18)
CO2: 30 mmol/L (ref 21–32)
Calcium: 8.9 MG/DL (ref 8.5–10.1)
Chloride: 109 mmol/L (ref 100–111)
Creatinine: 0.74 MG/DL (ref 0.6–1.3)
GFR African American: >60 mL/min/{1.73_m2} (ref >60)
Globulin: 4.4 g/dL — ABNORMAL HIGH (ref 2.0–4.0)
Glucose: 99 mg/dL (ref 74–99)
Potassium: 4.1 mmol/L (ref 3.5–5.5)
Sodium: 139 mmol/L (ref 136–145)
Total Bilirubin: 0.3 MG/DL (ref 0.2–1.0)
Total Protein: 8.5 g/dL — ABNORMAL HIGH (ref 6.4–8.2)
eGFR NON-AA: >60 mL/min/{1.73_m2} (ref >60)

## 2020-06-02 LAB — CBC WITH AUTO DIFFERENTIAL
Basophils %: 1 % (ref 0–2)
Basophils Absolute: 0 10*3/uL (ref 0.0–0.1)
Eosinophils %: 6 % — ABNORMAL HIGH (ref 0–5)
Eosinophils Absolute: 0.3 10*3/uL (ref 0.0–0.4)
Hematocrit: 40.1 % (ref 35.0–45.0)
Hemoglobin: 12.8 g/dL (ref 12.0–16.0)
Lymphocytes %: 54 % — ABNORMAL HIGH (ref 21–52)
Lymphocytes Absolute: 2.9 10*3/uL (ref 0.9–3.6)
MCH: 28.3 PG (ref 24.0–34.0)
MCHC: 31.9 g/dL (ref 31.0–37.0)
MCV: 88.7 FL (ref 74.0–97.0)
MPV: 10.8 FL (ref 9.2–11.8)
Monocytes %: 6 % (ref 3–10)
Monocytes Absolute: 0.3 10*3/uL (ref 0.05–1.2)
Neutrophils %: 34 % — ABNORMAL LOW (ref 40–73)
Neutrophils Absolute: 1.9 10*3/uL (ref 1.8–8.0)
Platelets: 220 10*3/uL (ref 135–420)
RBC: 4.52 M/uL (ref 4.20–5.30)
RDW: 12.9 % (ref 11.6–14.5)
WBC: 5.5 10*3/uL (ref 4.6–13.2)

## 2020-06-02 MED ORDER — KETOROLAC TROMETHAMINE 30 MG/ML INJECTION
30 mg/mL (1 mL) | INTRAMUSCULAR | Status: AC
Start: 2020-06-02 — End: 2020-06-02
  Administered 2020-06-02: via INTRAVENOUS

## 2020-06-02 MED ORDER — KETOROLAC TROMETHAMINE 10 MG TAB
10 mg | ORAL_TABLET | Freq: Four times a day (QID) | ORAL | 0 refills | Status: DC | PRN
Start: 2020-06-02 — End: 2020-10-05

## 2020-06-02 MED ORDER — PROMETHAZINE 25 MG TAB
25 mg | ORAL_TABLET | Freq: Four times a day (QID) | ORAL | 0 refills | Status: DC | PRN
Start: 2020-06-02 — End: 2020-06-10

## 2020-06-02 MED FILL — KETOROLAC TROMETHAMINE 30 MG/ML INJECTION: 30 mg/mL (1 mL) | INTRAMUSCULAR | Qty: 1

## 2020-06-02 NOTE — ED Notes (Signed)
I have reviewed discharge instructions with the patient.  The patient verbalized understanding.Patient armband removed and shredded

## 2020-06-02 NOTE — ED Provider Notes (Signed)
ED Provider Notes by Richardine Service, PA-C at 06/02/20 1723                Author: Selinda Flavin  Service: Emergency Medicine  Author Type: Physician Assistant       Filed: 06/02/20 2048  Date of Service: 06/02/20 1723  Status: Attested           Editor: Selinda Flavin (Physician Assistant)  Cosigner: Alveria Apley, DO at 06/03/20 1036          Attestation signed by Alveria Apley, DO at 06/03/20 1036          Attending Physician Addendum:      This patient was evaluated separately by the midlevel provider, Dionne Bucy, PA-C. I did not personally evaluate the patient but was immediately available for consultation if needed. After review of patient's chart, vital signs, results of objective  studies and H&P note, I agree with management above. My impression is Acute Headache, possible Migraine vs Cluster. Doubt SAH, meningitis, closed head injury.       Alveria Apley, DO   [facility]   Emergency Physician                                    EMERGENCY DEPARTMENT HISTORY AND PHYSICAL EXAM      Date: 06/02/2020   Patient Name: Susan Jimenez        History of Presenting Illness          Chief Complaint       Patient presents with        ?  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 HA is intermittent. Sometimes frontal and sometimes parietal. HA resolves with  time. Associated sxs include nausea and photophobia. Pt notes HA is not thunderclap. Not worst HA of life. No dx of migraines. Pt notes HA today is slightly different from previous headaches.  Pt denies fever, chills, sore throat, cough, congestion, myalgias,  COVID exposures, and any other sxs or complaints.       PCP: [facility]           Past History        Past Medical History:   History reviewed. No pertinent past  medical history.      Past Surgical History:   No past surgical history on file.      Family History:   History reviewed. No pertinent family history.      Social History:     Social History          Tobacco Use         ?  Smoking status:  Never Smoker     ?  Smokeless tobacco:  Never Used       Substance Use Topics         ?  Alcohol use:  Never         ?  Drug use:  Never           Allergies:   No Known Allergies  Review of Systems     Review of Systems    Constitutional: Negative for chills and fever.    HENT: Negative for congestion, sinus pressure and sore throat.     Respiratory: Negative for cough.     Gastrointestinal: Positive for nausea. Negative for vomiting.    Musculoskeletal: Negative for myalgias and neck pain.    Neurological: Positive for headaches.    All other systems reviewed and are negative.           Physical Exam          Vitals:          06/02/20 1615        BP:  113/74     Pulse:  67     Resp:  20     Temp:  97.3 ??F (36.3 ??C)     SpO2:  100%     Weight:  68 kg (150 lb)        Height:  '5\' 5"'$  (1.651 m)        Physical Exam   Vitals and nursing note reviewed.   Constitutional:        General: She is not in acute distress.     Appearance: She is well-developed. She is not diaphoretic.      Comments: AA female in NAD. Alert. Ambulatory in ED fast track room.       HENT :       Head: Normocephalic and atraumatic.      Right Ear: External ear normal.      Left Ear: External ear normal.      Nose: Nose normal.   Eyes:       General: No scleral icterus.        Right eye: No discharge.         Left eye: No discharge.      Conjunctiva/sclera: Conjunctivae normal.      Pupils: Pupils are equal, round, and reactive to light.    Cardiovascular:       Rate and Rhythm: Normal rate and regular rhythm.      Heart sounds: Normal heart sounds. No murmur heard.   No friction rub. No gallop.    Pulmonary:       Effort: Pulmonary effort is normal. No tachypnea, accessory muscle usage or respiratory  distress.      Breath sounds: Normal breath sounds . No decreased breath sounds, wheezing, rhonchi or rales.   Musculoskeletal:          General: Normal range of motion.      Cervical back: Normal range of motion and neck supple. No pain with movement.    Skin:      General: Skin is warm and dry.   Neurological :       Mental Status: She is alert and oriented to person, place, and time. Mental status is at baseline.      GCS: GCS eye subscore is 4 . GCS verbal subscore is 5. GCS motor subscore is 6 .      Cranial Nerves: Cranial nerves are intact. No cranial nerve deficit.      Sensory: Sensation is intact.      Motor: Motor function is intact. No weakness.      Coordination: Coordination is intact.      Gait: Gait is intact.  Gait normal.   Psychiatric :         Behavior: Behavior normal.  Judgment: Judgment normal.                     Diagnostic Study Results        Labs -         Recent Results (from the past 12 hour(s))     URINALYSIS W/ RFLX MICROSCOPIC          Collection Time: 06/02/20  5:48 PM         Result  Value  Ref Range            Color  YELLOW          Appearance  CLEAR          Specific gravity  1.027  1.005 - 1.030         pH (UA)  8.0  5.0 - 8.0         Protein  Negative  NEG mg/dL       Glucose  Negative  NEG mg/dL       Ketone  Negative  NEG mg/dL       Bilirubin  Negative  NEG         Blood  Negative  NEG         Urobilinogen  1.0  0.2 - 1.0 EU/dL       Nitrites  Negative  NEG         Leukocyte Esterase  Negative  NEG         HCG URINE, QL. - POC          Collection Time: 06/02/20  5:53 PM         Result  Value  Ref Range            Pregnancy test,urine (POC)  Negative  NEG         CBC WITH AUTOMATED DIFF          Collection Time: 06/02/20  6:20 PM         Result  Value  Ref Range            WBC  5.5  4.6 - 13.2 K/uL       RBC  4.52  4.20 - 5.30 M/uL       HGB  12.8  12.0 - 16.0 g/dL       HCT  40.1  35.0 - 45.0 %       MCV  88.7  74.0 - 97.0 FL       MCH  28.3  24.0 - 34.0 PG       MCHC   31.9  31.0 - 37.0 g/dL       RDW  12.9  11.6 - 14.5 %       PLATELET  220  135 - 420 K/uL       MPV  10.8  9.2 - 11.8 FL       NEUTROPHILS  34 (L)  40 - 73 %       LYMPHOCYTES  54 (H)  21 - 52 %       MONOCYTES  6  3 - 10 %       EOSINOPHILS  6 (H)  0 - 5 %       BASOPHILS  1  0 - 2 %       ABS. NEUTROPHILS  1.9  1.8 - 8.0 K/UL       ABS. LYMPHOCYTES  2.9  0.9 - 3.6 K/UL  ABS. MONOCYTES  0.3  0.05 - 1.2 K/UL       ABS. EOSINOPHILS  0.3  0.0 - 0.4 K/UL       ABS. BASOPHILS  0.0  0.0 - 0.1 K/UL       DF  AUTOMATED          METABOLIC PANEL, COMPREHENSIVE          Collection Time: 06/02/20  6:20 PM         Result  Value  Ref Range            Sodium  139  136 - 145 mmol/L       Potassium  4.1  3.5 - 5.5 mmol/L       Chloride  109  100 - 111 mmol/L       CO2  30  21 - 32 mmol/L       Anion gap  0 (L)  3.0 - 18 mmol/L            Glucose  99  74 - 99 mg/dL            BUN  15  7.0 - 18 MG/DL       Creatinine  0.74  0.6 - 1.3 MG/DL       BUN/Creatinine ratio  20  12 - 20         GFR est AA  >60  >60 ml/min/1.65m       GFR est non-AA  >60  >60 ml/min/1.763m      Calcium  8.9  8.5 - 10.1 MG/DL       Bilirubin, total  0.3  0.2 - 1.0 MG/DL       ALT (SGPT)  20  13 - 56 U/L       AST (SGOT)  15  10 - 38 U/L       Alk. phosphatase  75  45 - 117 U/L       Protein, total  8.5 (H)  6.4 - 8.2 g/dL       Albumin  4.1  3.4 - 5.0 g/dL       Globulin  4.4 (H)  2.0 - 4.0 g/dL       A-G Ratio  0.9  0.8 - 1.7         LIPASE          Collection Time: 06/02/20  6:20 PM         Result  Value  Ref Range            Lipase  131  73 - 393 U/L             No orders to display          CT Results   (Last 48 hours)          None                 CXR Results   (Last 48 hours)          None                  Medications given in the ED-     Medications       ketorolac (TORADOL) injection 30 mg (30 mg IntraVENous Given 06/02/20 1931)                Medical Decision Making     I am the first provider for this  patient.      I reviewed the vital signs,  available nursing notes, past medical history, past surgical history, family history and social history.      Vital Signs-Reviewed the patient's vital signs.      Pulse Oximetry Analysis - 100% on RA. NORMAL       Records Reviewed: Nursing Notes      Provider Notes (Medical Decision Making): migraine, tension, cluster, sinusitis, pseudotumor, TA, SAH. Doubt meningitis or CVA/TIA      Procedures:   Procedures      ED Course:    5:23 PM Initial assessment performed. The patients presenting problems have been discussed, and they are in agreement with the care plan formulated and outlined with them.  I have encouraged them to ask questions as they arise throughout their visit.        Diagnosis and Disposition           5:58 PM   Pt initially agrees to [place] but when transport came to get patient she refused. Will cancel CT HEAD. No FND. Labs unremarkable. Will tx sxs. FU with [facility] PCM. No indication for emergent LP or  MRI.             1.  Acute nonintractable headache, unspecified headache type            PLAN:   1. D/C Home   2.      Discharge Medication List as of 06/02/2020  6:56 PM              START taking these medications          Details        ketorolac (TORADOL) 10 mg tablet  Take 1 Tablet by mouth every six (6) hours as needed for Pain. Take with food. Do NOT take with other NSAIDs (ibuprofen, naproxen), Normal, Disp-12 Tablet,  R-0               promethazine (PHENERGAN) 25 mg tablet  Take 1 Tablet by mouth every six (6) hours as needed (as needed for nause. will also help with headache)., Normal, Disp-12 Tablet, R-0                      3.      Follow-up Information               Follow up With  Specialties  Details  Why  Contact Info              [facility]        [phone]              [facility] EMERGENCY DEPT  Emergency Medicine      [place]   [phone]             _______________________________      Attestations:   This note is prepared by Dionne Bucy, PA-C.    _______________________________               Please note that this dictation was completed with Dragon, the computer voice recognition software.  Quite often unanticipated grammatical, syntax, homophones, and other interpretive errors are  inadvertently transcribed by the computer software.  Please disregard these errors.  Please excuse any errors that have escaped final proofreading.

## 2020-06-02 NOTE — ED Notes (Signed)
Patient with complaints of a migraine for 1-2 weeks accompanied by nausea.  Patient states that it is worse today

## 2020-06-10 ENCOUNTER — Inpatient Hospital Stay
Admit: 2020-06-10 | Discharge: 2020-06-11 | Disposition: A | Discharge status: Home / Self Care | Payer: TRICARE (CHAMPUS) | Attending: Emergency Medicine

## 2020-06-10 ENCOUNTER — Emergency Department: Admit: 2020-06-11 | Payer: TRICARE (CHAMPUS) | Primary: Family Medicine

## 2020-06-10 DIAGNOSIS — R519 Headache, unspecified: Secondary | ICD-10-CM

## 2020-06-10 NOTE — ED Notes (Signed)
Patient ambulatory to triage with c/o b/l temporal and posterior headache along with nausea x2 weeks. Patient seen x2 weeks ago and reports headache or nausea have not improved with medications. Alert and oriented.

## 2020-06-10 NOTE — ED Provider Notes (Signed)
EMERGENCY DEPARTMENT HISTORY AND PHYSICAL EXAM    Date: 06/10/2020  Patient Name: Susan Jimenez    History of Presenting Illness     Chief Complaint   Patient presents with   ??? Headache   ??? Nausea         History Provided By: Patient    Susan Jimenez is a 23 y.o. female who presents to the emergency department C/O headache, nausea.  Patient states she has been having this headache recurrently and persistently for the last 2 weeks.  Localizes the headache to the bilateral temples and across her forehead.  She reports never being officially diagnosed with migraines in the past.  States this is a new problem for her.  States she was seen at our facility a couple weeks ago was prescribed some medications but does not seem like it is helping.Marland Kitchen         PCP: Other, Phys, MD    Current Facility-Administered Medications   Medication Dose Route Frequency Provider Last Rate Last Admin   ??? dexamethasone (DECADRON) 4 mg/mL injection 10 mg  10 mg IntraVENous ONCE Sascha Baugher A, DO       ??? sodium chloride 0.9 % bolus infusion 1,000 mL  1,000 mL IntraVENous ONCE Trista Ciocca A, DO 1,000 mL/hr at 06/10/20 2134 1,000 mL at 06/10/20 2134     Current Outpatient Medications   Medication Sig Dispense Refill   ??? butalbitaL-acetaminophen 25-325 mg tab Take 1 Tablet by mouth every six (6) hours as needed (headache). 20 Tablet 0   ??? metoclopramide HCl (Reglan) 10 mg tablet Take 1 Tablet by mouth every six (6) hours as needed for Nausea. 20 Tablet 0   ??? ketorolac (TORADOL) 10 mg tablet Take 1 Tablet by mouth every six (6) hours as needed for Pain. Take with food. Do NOT take with other NSAIDs (ibuprofen, naproxen) 12 Tablet 0       Past History     Past Medical History:  History reviewed. No pertinent past medical history.    Past Surgical History:  History reviewed. No pertinent surgical history.    Family History:  History reviewed. No pertinent family history.    Social History:  Social History     Tobacco Use   ??? Smoking status:  Never Smoker   ??? Smokeless tobacco: Never Used   Substance Use Topics   ??? Alcohol use: Never   ??? Drug use: Never       Allergies:  No Known Allergies      Review of Systems   Review of Systems   Constitutional: Negative for fatigue.   HENT: Negative for dental problem.    Eyes: Negative for redness.   Respiratory: Negative for cough.    Cardiovascular: Negative for chest pain.   Gastrointestinal: Positive for nausea. Negative for vomiting.   Genitourinary: Negative for dysuria.   Neurological: Positive for headaches. Negative for weakness.     All other systems reviewed and are negative.    Physical Exam     Vitals:    06/10/20 1942   BP: 117/80   Pulse: 80   Resp: 16   Temp: 98.6 ??F (37 ??C)   SpO2: 100%   Weight: 68 kg (150 lb)   Height: 5\' 5"  (1.651 m)     Physical Exam    Nursing notes and vital signs reviewed    Constitutional: Non toxic appearing, no acute distress, appearing stated age  Eyes: PERRL, EOMI, No conjunctival injection  ENT: external ears normal, No rhinorrhea, external nose normal, mucous membranes moist  Cardiovascular: Regular rate and rhythm, no murmurs, No JVD  Respiratory: Clear to ausculation bilaterally, No stridor, Normal work of breathing and chest excursion bilaterally  Abdomen: Soft, non tender, non distended, normoactive bowel sounds, No rigidity, no peritoneal signs  Musculoskeletal:  No evidence of obvious injury to Head, Neck, Back, Extremities, no LE edema  Skin: Warm, dry, No obvious rashes  Neuro: Alert and oriented x 3, CN 2-12 intact, normal speech, strength and sensation full and symmetric bilaterally  Psychiatric: Normal mood and affect      Diagnostic Study Results     Labs -   No results found for this or any previous visit (from the past 72 hour(s)).    Radiologic Studies -   CT HEAD WO CONT   Final Result      No acute intracranial abnormalities.        CT Results  (Last 48 hours)               06/10/20 2036  CT HEAD WO CONT Final result    Impression:      No acute  intracranial abnormalities.       Narrative:  EXAM: CT head       INDICATION: Headache.       COMPARISON: None.       TECHNIQUE: Axial CT imaging of the head was performed without intravenous   contrast.       One or more dose reduction techniques were used on this CT: automated exposure   control, adjustment of the mAs and/or kVp according to patient size, and   iterative reconstruction techniques.  The specific techniques used on this CT   exam have been documented in the patient's electronic medical record.   Digital Imaging and Communications in the Medicine (DICOM) format image data are   available to nonaffiliated external healthcare facilities or entities on a   secure, media free, reciprocally searchable basis with patient authorization for   at least a 12 month period after this study.   _______________       FINDINGS:       BRAIN AND POSTERIOR FOSSA: The sulci, ventricles and basal cisterns  are within   normal limits for the patient's age.. There is no intracranial hemorrhage, mass   effect, or midline shift.  There are no areas of abnormal parenchymal   attenuation. Marland Kitchen       EXTRA-AXIAL SPACES AND MENINGES: There are no abnormal extra-axial fluid   collections.       CALVARIUM: Intact.       SINUSES: Clear.       OTHER: None.       _______________               CXR Results  (Last 48 hours)    None          Medications given in the ED-  Medications   dexamethasone (DECADRON) 4 mg/mL injection 10 mg (has no administration in time range)   sodium chloride 0.9 % bolus infusion 1,000 mL (1,000 mL IntraVENous New Bag 06/10/20 2134)   prochlorperazine (COMPAZINE) injection 10 mg (10 mg IntraVENous Given 06/10/20 2136)   diphenhydrAMINE (BENADRYL) injection 25 mg (25 mg IntraVENous Given 06/10/20 2139)   ketorolac (TORADOL) injection 30 mg (30 mg IntraVENous Given 06/10/20 2135)         Medical Decision Making     I reviewed the  vital signs, available nursing notes, past medical history, past surgical history, family  history and social history.    Vital Signs interpretation- I have reviewed the patient's vital signs.    Pulse Oximetry interpretation - 100% on Room air     Cardiac Monitor interpretation:  Rate: 80 bpm  Rhythm: sinus      Records Reviewed: Nursing Notes and Old Medical Records    Procedures:  Procedures    ED Course and MDM:  Patient's headache is most likely tension in nature although when she was seen here a couple weeks ago she had some blood work and urinary studies that were normal but never had any imaging done of her brain.  Considering the persistence and recurrence of her headache we will treat her with IV Toradol, Compazine, Benadryl, Decadron and obtain CT head    CT scan showed no evidence of acute process.  Patient does appear more comfortable at this time    I discussed with the patient that there are symptoms appear as a tension type or migraine headache.  Recommended to continue with rest, fluid rehydration, over-the-counter Tylenol and Motrin as well as any other prescription medications as directed.  Recommended to follow-up with their primary care physician and/or neurologist for reevaluation and long-term management of their headaches.  Recommended to come back to the emergency department the patient develops worsening other symptoms or signs of numbness or weakness.  They understand and agree to plan.       Diagnosis and Disposition       DISCHARGE NOTE:    Susan Jimenez's  results have been reviewed with her.  She has been counseled regarding her diagnosis, treatment, and plan.  She verbally conveys understanding and agreement of the signs, symptoms, diagnosis, treatment and prognosis and additionally agrees to follow up as discussed.  She also agrees with the care-plan and conveys that all of her questions have been answered.  I have also provided discharge instructions for her that include: educational information regarding their diagnosis and treatment, and list of reasons why they would  want to return to the ED prior to their follow-up appointment, should her condition change. She has been provided with education for proper emergency department utilization.     CLINICAL IMPRESSION:    1. Frontal headache        PLAN:  1. D/C Home  2.   Current Discharge Medication List      START taking these medications    Details   butalbitaL-acetaminophen 25-325 mg tab Take 1 Tablet by mouth every six (6) hours as needed (headache).  Qty: 20 Tablet, Refills: 0  Start date: 06/10/2020      metoclopramide HCl (Reglan) 10 mg tablet Take 1 Tablet by mouth every six (6) hours as needed for Nausea.  Qty: 20 Tablet, Refills: 0  Start date: 06/10/2020           3.   Follow-up Information     Follow up With Specialties Details Why Contact Info    Jannet Askew, MD Neurology Schedule an appointment as soon as possible for a visit  For neurologist [id number] [address] [place] [address]  [phone]      [facility] EMERGENCY DEPT Emergency Medicine Go to  If symptoms worsen [address]  [place] [address]  [phone]        _______________________________      Please note that this dictation was completed with Dragon, the  computer [employer] education [employer].  Quite often unanticipated grammatical, syntax, homophones, and other interpretive errors are inadvertently transcribed by the computer software.  Please disregard these errors.  Please excuse any errors that have escaped final proofreading.

## 2020-06-11 MED ORDER — KETOROLAC TROMETHAMINE 30 MG/ML INJECTION
30 mg/mL (1 mL) | INTRAMUSCULAR | Status: AC
Start: 2020-06-11 — End: 2020-06-10
  Administered 2020-06-11: 02:00:00 via INTRAVENOUS

## 2020-06-11 MED ORDER — DEXAMETHASONE SODIUM PHOSPHATE 4 MG/ML IJ SOLN
4 mg/mL | Freq: Once | INTRAMUSCULAR | Status: AC
Start: 2020-06-11 — End: 2020-06-10
  Administered 2020-06-11: 02:00:00 via INTRAVENOUS

## 2020-06-11 MED ORDER — METOCLOPRAMIDE 10 MG TAB
10 mg | ORAL_TABLET | Freq: Four times a day (QID) | ORAL | 0 refills | Status: DC | PRN
Start: 2020-06-11 — End: 2020-10-05

## 2020-06-11 MED ORDER — BUTALBITAL 25 MG-ACETAMINOPHEN 325 MG TABLET
25-325 mg | ORAL_TABLET | Freq: Four times a day (QID) | ORAL | 0 refills | Status: DC | PRN
Start: 2020-06-11 — End: 2020-10-05

## 2020-06-11 MED ORDER — DIPHENHYDRAMINE HCL 50 MG/ML IJ SOLN
50 mg/mL | INTRAMUSCULAR | Status: AC
Start: 2020-06-11 — End: 2020-06-10
  Administered 2020-06-11: 02:00:00 via INTRAVENOUS

## 2020-06-11 MED ORDER — SODIUM CHLORIDE 0.9% BOLUS IV
0.9 % | Freq: Once | INTRAVENOUS | Status: AC
Start: 2020-06-11 — End: 2020-06-10
  Administered 2020-06-11: 02:00:00 via INTRAVENOUS

## 2020-06-11 MED ORDER — PROCHLORPERAZINE EDISYLATE 5 MG/ML INJECTION
5 mg/mL | Freq: Once | INTRAMUSCULAR | Status: AC
Start: 2020-06-11 — End: 2020-06-10
  Administered 2020-06-11: 02:00:00 via INTRAVENOUS

## 2020-06-11 MED FILL — PROCHLORPERAZINE EDISYLATE 5 MG/ML INJECTION: 5 mg/mL | INTRAMUSCULAR | Qty: 2

## 2020-06-11 MED FILL — DEXAMETHASONE SODIUM PHOSPHATE 4 MG/ML IJ SOLN: 4 mg/mL | INTRAMUSCULAR | Qty: 3

## 2020-06-11 MED FILL — KETOROLAC TROMETHAMINE 30 MG/ML INJECTION: 30 mg/mL (1 mL) | INTRAMUSCULAR | Qty: 1

## 2020-06-11 MED FILL — DIPHENHYDRAMINE HCL 50 MG/ML IJ SOLN: 50 mg/mL | INTRAMUSCULAR | Qty: 1

## 2020-06-11 MED FILL — SODIUM CHLORIDE 0.9 % IV: INTRAVENOUS | Qty: 1000

## 2020-08-04 ENCOUNTER — Inpatient Hospital Stay
Admit: 2020-08-04 | Discharge: 2020-08-04 | Disposition: A | Discharge status: Home / Self Care | Payer: TRICARE (CHAMPUS) | Attending: Emergency Medicine

## 2020-08-04 ENCOUNTER — Emergency Department: Admit: 2020-08-04 | Payer: TRICARE (CHAMPUS) | Primary: Family Medicine

## 2020-08-04 DIAGNOSIS — R0789 Other chest pain: Secondary | ICD-10-CM

## 2020-08-04 LAB — METABOLIC PANEL, COMPREHENSIVE
A-G Ratio: 0.8 (ref 0.8–1.7)
ALT (SGPT): 15 U/L (ref 13–56)
AST (SGOT): 11 U/L (ref 10–38)
Albumin: 3.6 g/dL (ref 3.4–5.0)
Alk. phosphatase: 60 U/L (ref 45–117)
Anion gap: 4 mmol/L (ref 3.0–18)
BUN/Creatinine ratio: 11 — ABNORMAL LOW (ref 12–20)
BUN: 9 MG/DL (ref 7.0–18)
Bilirubin, total: 0.6 MG/DL (ref 0.2–1.0)
CO2: 27 mmol/L (ref 21–32)
Calcium: 8.5 MG/DL (ref 8.5–10.1)
Chloride: 107 mmol/L (ref 100–111)
Creatinine: 0.8 MG/DL (ref 0.6–1.3)
GFR est AA: >60 mL/min/{1.73_m2} (ref >60)
GFR est non-AA: >60 mL/min/{1.73_m2} (ref >60)
Globulin: 4.3 g/dL — ABNORMAL HIGH (ref 2.0–4.0)
Glucose: 92 mg/dL (ref 74–99)
Potassium: 3.8 mmol/L (ref 3.5–5.5)
Protein, total: 7.9 g/dL (ref 6.4–8.2)
Sodium: 138 mmol/L (ref 136–145)

## 2020-08-04 LAB — CARDIAC PANEL,(CK, CKMB & TROPONIN)
CK - MB: <1 ng/ml (ref <3.6)
CK: 109 U/L (ref 26–192)
Troponin-I, QT: <0.02 NG/ML (ref 0.0–0.045)

## 2020-08-04 LAB — CBC WITH AUTOMATED DIFF
ABS. BASOPHILS: 0 10*3/uL (ref 0.0–0.1)
ABS. EOSINOPHILS: 0.2 10*3/uL (ref 0.0–0.4)
ABS. LYMPHOCYTES: 1.7 10*3/uL (ref 0.9–3.6)
ABS. MONOCYTES: 0.3 10*3/uL (ref 0.05–1.2)
ABS. NEUTROPHILS: 1.5 10*3/uL — ABNORMAL LOW (ref 1.8–8.0)
BASOPHILS: 1 % (ref 0–2)
EOSINOPHILS: 5 % (ref 0–5)
HCT: 35.3 % (ref 35.0–45.0)
HGB: 11.3 g/dL — ABNORMAL LOW (ref 12.0–16.0)
LYMPHOCYTES: 46 % (ref 21–52)
MCH: 28.2 PG (ref 24.0–34.0)
MCHC: 32 g/dL (ref 31.0–37.0)
MCV: 88 FL (ref 78.0–100.0)
MONOCYTES: 8 % (ref 3–10)
MPV: 10.8 FL (ref 9.2–11.8)
NEUTROPHILS: 41 % (ref 40–73)
PLATELET: 244 10*3/uL (ref 135–420)
RBC: 4.01 M/uL — ABNORMAL LOW (ref 4.20–5.30)
RDW: 12.8 % (ref 11.6–14.5)
WBC: 3.7 10*3/uL — ABNORMAL LOW (ref 4.6–13.2)

## 2020-08-04 LAB — D DIMER: D DIMER: 0.35 ug/ml(FEU) (ref <0.46)

## 2020-08-04 LAB — HCG QL SERUM
HCG, Ql.: NEGATIVE
Preg, Serum: NEGATIVE

## 2020-08-04 LAB — COMPREHENSIVE METABOLIC PANEL
ALT: 15 U/L (ref 13–56)
AST: 11 U/L (ref 10–38)
Albumin/Globulin Ratio: 0.8 (ref 0.8–1.7)
Albumin: 3.6 g/dL (ref 3.4–5.0)
Alkaline Phosphatase: 60 U/L (ref 45–117)
Anion Gap: 4 mmol/L (ref 3.0–18)
BUN/Creatinine Ratio: 11 — ABNORMAL LOW (ref 12–20)
BUN: 9 MG/DL (ref 7.0–18)
CO2: 27 mmol/L (ref 21–32)
Calcium: 8.5 MG/DL (ref 8.5–10.1)
Chloride: 107 mmol/L (ref 100–111)
Creatinine: 0.8 MG/DL (ref 0.6–1.3)
GFR African American: >60 mL/min/{1.73_m2} (ref >60)
Globulin: 4.3 g/dL — ABNORMAL HIGH (ref 2.0–4.0)
Glucose: 92 mg/dL (ref 74–99)
Potassium: 3.8 mmol/L (ref 3.5–5.5)
Sodium: 138 mmol/L (ref 136–145)
Total Bilirubin: 0.6 MG/DL (ref 0.2–1.0)
Total Protein: 7.9 g/dL (ref 6.4–8.2)
eGFR NON-AA: >60 mL/min/{1.73_m2} (ref >60)

## 2020-08-04 LAB — CBC WITH AUTO DIFFERENTIAL
Basophils %: 1 % (ref 0–2)
Basophils Absolute: 0 10*3/uL (ref 0.0–0.1)
Eosinophils %: 5 % (ref 0–5)
Eosinophils Absolute: 0.2 10*3/uL (ref 0.0–0.4)
Hematocrit: 35.3 % (ref 35.0–45.0)
Hemoglobin: 11.3 g/dL — ABNORMAL LOW (ref 12.0–16.0)
Lymphocytes %: 46 % (ref 21–52)
Lymphocytes Absolute: 1.7 10*3/uL (ref 0.9–3.6)
MCH: 28.2 PG (ref 24.0–34.0)
MCHC: 32 g/dL (ref 31.0–37.0)
MCV: 88 FL (ref 78.0–100.0)
MPV: 10.8 FL (ref 9.2–11.8)
Monocytes %: 8 % (ref 3–10)
Monocytes Absolute: 0.3 10*3/uL (ref 0.05–1.2)
Neutrophils %: 41 % (ref 40–73)
Neutrophils Absolute: 1.5 10*3/uL — ABNORMAL LOW (ref 1.8–8.0)
Platelets: 244 10*3/uL (ref 135–420)
RBC: 4.01 M/uL — ABNORMAL LOW (ref 4.20–5.30)
RDW: 12.8 % (ref 11.6–14.5)
WBC: 3.7 10*3/uL — ABNORMAL LOW (ref 4.6–13.2)

## 2020-08-04 LAB — CARDIAC PANEL
CK-MB: <1 ng/ml (ref <3.6)
Total CK: 109 U/L (ref 26–192)
Troponin I: <0.02 NG/ML (ref 0.0–0.045)

## 2020-08-04 LAB — D-DIMER, QUANTITATIVE: D-Dimer, Quant: 0.35 ug/ml(FEU) (ref <0.46)

## 2020-08-04 NOTE — ED Notes (Signed)
I have reviewed discharge instructions with the patient.  The patient verbalized understanding.

## 2020-08-04 NOTE — ED Provider Notes (Signed)
EMERGENCY DEPARTMENT HISTORY AND PHYSICAL EXAM    Date: 08/04/2020  Patient Name: Susan Jimenez    History of Presenting Illness     Chief Complaint   Patient presents with   ??? Chest Pain         History Provided By: Patient and EMS    10:10 AM  Susan Jimenez is a 24 y.o. female with PMHX of active duty [place] who presents to the emergency department C/O chest pain shortness of breath.  Patient reports the symptoms started yesterday around 3 PM.  She reports the pain is on the left side of her chest and feels like a pressure that she rates 7 out of 10.  No clear leaving exacerbating factors identified.  Does note that she got her first Covid 19 vaccine on Friday, 4 days ago.  Denies any fever, cough, vomiting, diarrhea, known sick contacts or recent travel.  She does not smoke.  She does not use any hormonal birth control.  She does report significant family history of heart disease in her father in his 89s.  MS reports that patient had some tachycardia in route.  The patient aspirin prior to arrival.    PCP: Other, Phys, MD    Current Outpatient Medications   Medication Sig Dispense Refill   ??? butalbitaL-acetaminophen 25-325 mg tab Take 1 Tablet by mouth every six (6) hours as needed (headache). 20 Tablet 0   ??? metoclopramide HCl (Reglan) 10 mg tablet Take 1 Tablet by mouth every six (6) hours as needed for Nausea. 20 Tablet 0   ??? ketorolac (TORADOL) 10 mg tablet Take 1 Tablet by mouth every six (6) hours as needed for Pain. Take with food. Do NOT take with other NSAIDs (ibuprofen, naproxen) 12 Tablet 0       Past History     Past Medical History:  History reviewed. No pertinent past medical history.    Past Surgical History:  History reviewed. No pertinent surgical history.    Family History:  History reviewed. No pertinent family history.    Social History:  Social History     Tobacco Use   ??? Smoking status: Never Smoker   ??? Smokeless tobacco: Never Used   Substance Use Topics   ??? Alcohol use: Never   ??? Drug  use: Never       Allergies:  No Known Allergies      Review of Systems   Review of Systems   Constitutional: Negative for fever.   Respiratory: Positive for shortness of breath.    Cardiovascular: Positive for chest pain.   Gastrointestinal: Negative for abdominal pain.   All other systems reviewed and are negative.        Physical Exam     Vitals:    08/04/20 1017 08/04/20 1019 08/04/20 1025   BP: 116/75     Pulse: 71 75    Resp: 20 16    Temp:  98.8 ??F (37.1 ??C)    SpO2:  99% 100%     Physical Exam    Nursing notes and vital signs reviewed    Constitutional: Non toxic appearing, moderate distress  Head: Normocephalic, Atraumatic  Eyes: EOMI  Neck: Supple  Cardiovascular: Regular rate and rhythm, no murmurs, rubs, or gallops  Chest: Normal work of breathing and chest excursion bilaterally  Lungs: Clear to ausculation bilaterally  Abdomen: Soft, non tender, non distended  Back: No evidence of trauma or deformity  Extremities: No evidence of trauma or  deformity, no LE edema  Skin: Warm and dry, normal cap refill  Neuro: Alert and appropriate  Psychiatric: Normal mood and affect      Diagnostic Study Results     Labs -     Recent Results (from the past 12 hour(s))   CBC WITH AUTOMATED DIFF    Collection Time: 08/04/20 10:19 AM   Result Value Ref Range    WBC 3.7 (L) 4.6 - 13.2 K/uL    RBC 4.01 (L) 4.20 - 5.30 M/uL    HGB 11.3 (L) 12.0 - 16.0 g/dL    HCT 35.3 35.0 - 45.0 %    MCV 88.0 78.0 - 100.0 FL    MCH 28.2 24.0 - 34.0 PG    MCHC 32.0 31.0 - 37.0 g/dL    RDW 12.8 11.6 - 14.5 %    PLATELET 244 135 - 420 K/uL    MPV 10.8 9.2 - 11.8 FL    NEUTROPHILS 41 40 - 73 %    LYMPHOCYTES 46 21 - 52 %    MONOCYTES 8 3 - 10 %    EOSINOPHILS 5 0 - 5 %    BASOPHILS 1 0 - 2 %    ABS. NEUTROPHILS 1.5 (L) 1.8 - 8.0 K/UL    ABS. LYMPHOCYTES 1.7 0.9 - 3.6 K/UL    ABS. MONOCYTES 0.3 0.05 - 1.2 K/UL    ABS. EOSINOPHILS 0.2 0.0 - 0.4 K/UL    ABS. BASOPHILS 0.0 0.0 - 0.1 K/UL    DF AUTOMATED     D DIMER    Collection Time: 08/04/20 10:19  AM   Result Value Ref Range    D DIMER 0.35 <0.46 ug/ml(FEU)   METABOLIC PANEL, COMPREHENSIVE    Collection Time: 08/04/20 10:19 AM   Result Value Ref Range    Sodium 138 136 - 145 mmol/L    Potassium 3.8 3.5 - 5.5 mmol/L    Chloride 107 100 - 111 mmol/L    CO2 27 21 - 32 mmol/L    Anion gap 4 3.0 - 18 mmol/L    Glucose 92 74 - 99 mg/dL    BUN 9 7.0 - 18 MG/DL    Creatinine 0.80 0.6 - 1.3 MG/DL    BUN/Creatinine ratio 11 (L) 12 - 20      GFR est AA >60 >60 ml/min/1.59m    GFR est non-AA >60 >60 ml/min/1.736m   Calcium 8.5 8.5 - 10.1 MG/DL    Bilirubin, total 0.6 0.2 - 1.0 MG/DL    ALT (SGPT) 15 13 - 56 U/L    AST (SGOT) 11 10 - 38 U/L    Alk. phosphatase 60 45 - 117 U/L    Protein, total 7.9 6.4 - 8.2 g/dL    Albumin 3.6 3.4 - 5.0 g/dL    Globulin 4.3 (H) 2.0 - 4.0 g/dL    A-G Ratio 0.8 0.8 - 1.7     CARDIAC PANEL,(CK, CKMB & TROPONIN)    Collection Time: 08/04/20 10:19 AM   Result Value Ref Range    CK - MB <1.0 <3.6 ng/ml    CK-MB Index  0.0 - 4.0 %     CALCULATION NOT PERFORMED WHEN RESULT IS BELOW LINEAR LIMIT    CK 109 26 - 192 U/L    Troponin-I, QT <0.02 0.0 - 0.045 NG/ML   HCG QL SERUM    Collection Time: 08/04/20 10:19 AM   Result Value Ref Range    HCG, Ql. Negative NEG  Radiologic Studies -   XR CHEST PORT   Final Result      No acute findings in the chest.         CT Results  (Last 48 hours)    None        CXR Results  (Last 48 hours)               08/04/20 1035  XR CHEST PORT Final result    Impression:      No acute findings in the chest.        Narrative:  EXAM: XR CHEST PORT       CLINICAL INDICATION/HISTORY: chest pain   -Additional: None       COMPARISON: None       TECHNIQUE: Frontal view of the chest       _______________       FINDINGS:       HEART AND MEDIASTINUM: Normal cardiac size and mediastinal contours.       LUNGS AND PLEURAL SPACES: No focal pneumonic consolidation, pneumothorax, or   pleural effusion.       BONY THORAX AND SOFT TISSUES: No acute osseous abnormality        _______________                 Medications given in the ED-  Medications - No data to display      Medical Decision Making   I am the first provider for this patient.    I reviewed the vital signs, available nursing notes, past medical history, past surgical history, family history and social history.    Vital Signs-Reviewed the patient's vital signs.    Pulse Oximetry Analysis - 99% on room air, not hypoxic     Cardiac Monitor:  Rate: 73 bpm  Rhythm: Normal sinus    EKG interpretation: (Preliminary)  EKG read by Dr. Philemon Kingdom at 10:22 AM  Normal sinus rhythm at a rate of 70 bpm, PR interval 174 ms, QRS duration of 70 ms, no prior available for comparison    Records Reviewed: Nursing Notes    Provider Notes (Medical Decision Making): Susan Jimenez is a 24 y.o. female presenting for chest pain.  Wells low risk, PERC positive, D-dimer negative.  Low risk heart score with negative cardiac enzymes and EKG greater than 6 hours from onset of symptoms.  Chest x-ray, labs benign.  Plan for discharge with primary care and cardiology follow-up with return precautions.  Patient understands and agrees with this plan.    Procedures:  Procedures    ED Course:   11:17 AM  Updated patient on all results and plan.  All questions answered.    Diagnosis and Disposition     Critical Care: None    DISCHARGE NOTE:    Susan Jimenez's  results have been reviewed with her.  She has been counseled regarding her diagnosis, treatment, and plan.  She verbally conveys understanding and agreement of the signs, symptoms, diagnosis, treatment and prognosis and additionally agrees to follow up as discussed.  She also agrees with the care-plan and conveys that all of her questions have been answered.  I have also provided discharge instructions for her that include: educational information regarding their diagnosis and treatment, and list of reasons why they would want to return to the ED prior to their follow-up appointment, should her  condition change. She has been provided with education for proper emergency department utilization.     CLINICAL  IMPRESSION:    1. Atypical chest pain        PLAN:  1. D/C Home  2.   Current Discharge Medication List        3.   Follow-up Information     Follow up With Specialties Details Why [place]  Schedule an appointment as soon as possible for a visit   [place]  [phone]    Zipporah Plants, MD Cardiology Schedule an appointment as soon as possible for a visit   [place]  [address] [place] [address]  [phone]      [facility] EMERGENCY DEPT Emergency Medicine  If symptoms worsen [address]  [place] [address]  [phone]        _______________________________      Please note that this dictation was completed with Dragon, the computer voice recognition software.  Quite often unanticipated grammatical, syntax, homophones, and other interpretive errors are inadvertently transcribed by the computer software.  Please disregard these errors.  Please excuse any errors that have escaped final proofreading.

## 2020-08-05 LAB — EKG, 12 LEAD, INITIAL
Atrial Rate: 70 {beats}/min
Calculated P Axis: 62 degrees
Calculated R Axis: 61 degrees
Calculated T Axis: 51 degrees
Diagnosis: NORMAL
P-R Interval: 174 ms
Q-T Interval: 380 ms
QRS Duration: 70 ms
QTC Calculation (Bezet): 410 ms
Ventricular Rate: 70 {beats}/min

## 2020-08-05 LAB — EKG 12-LEAD
Atrial Rate: 70 {beats}/min
Diagnosis: NORMAL
P Axis: 62 degrees
P-R Interval: 174 ms
Q-T Interval: 380 ms
QRS Duration: 70 ms
QTc Calculation (Bazett): 410 ms
R Axis: 61 degrees
T Axis: 51 degrees
Ventricular Rate: 70 {beats}/min

## 2020-10-05 ENCOUNTER — Inpatient Hospital Stay
Admit: 2020-10-05 | Discharge: 2020-10-05 | Disposition: A | Discharge status: Home / Self Care | Payer: TRICARE (CHAMPUS) | Attending: Emergency Medicine

## 2020-10-05 DIAGNOSIS — J029 Acute pharyngitis, unspecified: Secondary | ICD-10-CM

## 2020-10-05 MED ORDER — AMOXICILLIN 500 MG TABLET
500 mg | ORAL_TABLET | Freq: Three times a day (TID) | ORAL | 0 refills | Status: AC
Start: 2020-10-05 — End: 2020-10-15

## 2020-10-05 MED ORDER — METHYLPREDNISOLONE 4 MG TABS IN A DOSE PACK
4 mg | ORAL | 0 refills | Status: AC
Start: 2020-10-05 — End: ?

## 2020-10-05 NOTE — ED Notes (Signed)
Pt presents for sore throat and cough for 10 days. Neg swab (flu, covid, strep) on 12/16. Taking mucinex. Denies NVD, fever

## 2020-10-05 NOTE — ED Notes (Signed)
I have reviewed discharge instructions with the patient.  The patient verbalized understanding.Patient {ARMBANDS:[id number]  Only saw patient to discharge

## 2020-10-05 NOTE — ED Provider Notes (Signed)
ED Provider Notes by Weston Settle, PA-C at 10/05/20 1025                Author: Earl Gala  Service: Emergency Medicine  Author Type: Physician Assistant       Filed: 10/05/20 1103  Date of Service: 10/05/20 1025  Status: Attested           Editor: Earl Gala (Physician Assistant)  Cosigner: Jonna Coup, MD at 10/05/20 2015          Attestation signed by Jonna Coup, MD at 10/05/20 2015          MLP ATTESTATION:   I was present in the immediate clinical area during the time of the patient evaluation and available to provide input.  I have reviewed the chart and agree with the documentation recorded by the [facility], including the assessment, treatment plan, and disposition.  Any procedures performed, as noted, were under my direction and / or I was present for the key portions.   Jonna Coup, MD                                    EMERGENCY DEPARTMENT HISTORY AND PHYSICAL EXAM      Date: 10/05/2020   Patient Name: Susan Jimenez        History of Presenting Illness          Chief Complaint       Patient presents with        ?  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 Associated sxs include resolving congestion and cough. Pt notes pain to left side of neck.  Sxs x 10 days. Negative strep,  COVID, and influenza. Pt notes no ABX therapy. Pt endorses hx of GERD and takes OTC meds. Pt is nonsmoker. No hx of ENT problems or surgeries. Pt denies fever, ear pain, post-nasal drip, seasonal allergies, and any other sxs or complaints.       PCP: [facility]        Current Outpatient Medications          Medication  Sig  Dispense  Refill           ?  methylPREDNISolone (Medrol, Pak,) 4 mg tablet  Take with food.  1 Dose Pack  0           ?  amoxicillin 500 mg tab  Take 500 mg by mouth three (3) times  daily for 10 days.  30 Tablet  0             Past History        Past Medical History:   History reviewed. No pertinent past medical history.      Past Surgical History:   History reviewed. No pertinent surgical history.      Family History:   History reviewed. No pertinent family history.      Social History:     Social History          Tobacco Use         ?  Smoking status:  Never Smoker     ?  Smokeless tobacco:  Never Used       Substance Use Topics         ?  Alcohol use:  Never         ?  Drug use:  Never           Allergies:   No Known Allergies           Review of Systems     Review of Systems    Constitutional: Negative for chills and fever.    HENT: Positive for sore throat and trouble swallowing . Negative for congestion (resolved) and ear pain.     Respiratory: Negative for cough (resolving).     Musculoskeletal: Positive for neck pain.    Allergic/Immunologic: Negative for immunocompromised state.    All other systems reviewed and are negative.           Physical Exam          Vitals:          10/05/20 1013        BP:  124/74     Pulse:  69     Resp:  12     Temp:  97.5 ??F (36.4 ??C)     SpO2:  100%     Weight:  68 kg (150 lb)        Height:  5\' 5"  (1.651 m)        Physical Exam   Vitals and nursing note reviewed.   Constitutional:        General: She is not in acute distress.     Appearance: She is well-developed. She is not diaphoretic.      Comments: AA female in NAD. Alert. In [employer] uniform.     HENT :       Head: Normocephalic and atraumatic.      Jaw: No trismus.      Right Ear: External ear normal. No swelling or tenderness. Tympanic membrane is not perforated, erythematous or bulging.      Left Ear: External ear normal. No swelling or tenderness.  Tympanic membrane is not perforated, erythematous or bulging.      Nose: Nose normal. No mucosal edema or rhinorrhea.      Right Sinus: No maxillary sinus tenderness or frontal sinus tenderness.      Left Sinus: No maxillary sinus tenderness  or  frontal sinus tenderness.      Mouth/Throat:      Mouth: No oral lesions.      Dentition: No dental abscesses.      Pharynx: Uvula midline . No oropharyngeal exudate, posterior oropharyngeal erythema or uvula swelling.      Tonsils: No tonsillar abscesses.    Eyes:       General: No scleral icterus.        Right eye: No discharge.         Left eye: No discharge.      Conjunctiva/sclera: Conjunctivae normal.   Cardiovascular :       Rate and Rhythm: Normal rate and regular rhythm.      Heart sounds: Normal heart sounds. No murmur heard.   No friction rub. No gallop.     Pulmonary:       Effort: Pulmonary effort is normal. No tachypnea, accessory muscle usage or respiratory distress.      Breath sounds: Normal breath sounds . No decreased breath sounds, wheezing, rhonchi or rales.  Musculoskeletal:          General: No tenderness. Normal range of motion.      Cervical back: Normal range of motion.     Lymphadenopathy:       Head:      Right side of head: No submental, submandibular, tonsillar, preauricular or posterior auricular adenopathy.      Left side of head: No submental, submandibular, tonsillar, preauricular, posterior auricular or occipital adenopathy.       Cervical: No cervical adenopathy.   Skin :      General: Skin is warm and dry.   Neurological :       Mental Status: She is alert and oriented to person, place, and time.    Psychiatric:         Judgment: Judgment normal.                     Diagnostic Study Results        Labs -    No results found for this or any previous visit (from the past 12 hour(s)).           No orders to display          CT Results   (Last 48 hours)          None                 CXR Results   (Last 48 hours)          None                  Medications given in the ED-   Medications - No data to display           Medical Decision Making     I am the first provider for this patient.      I reviewed the vital signs, available nursing notes, past medical history, past surgical  history, family history and social history.      Vital Signs-Reviewed the patient's vital signs.      Pulse Oximetry Analysis - 100% on RA. NORMAL       Records Reviewed: Nursing Notes      Provider Notes (Medical Decision Making): URI, strep, sinusitis, allergic. No evidence of PTA, RPA, or ludwig's      Procedures:   Procedures      ED Course:    10:25 AM Initial assessment performed. The patients presenting problems have been discussed, and they are in agreement with the care plan formulated and outlined with them.  I have encouraged them to ask questions as they arise throughout their visit.        Diagnosis and Disposition           No PTA or RPA. No evidence of ludwig's. Handling secretions. No hot potato voice. Given duration of sxs will tx with ABX and steroids. [facility] PCM FU. Reasons to RTED discussed with pt. All questions  answered. Pt feels comfortable going home at this time. Pt expressed understanding and she agrees with plan.               1.  Acute pharyngitis, unspecified etiology            PLAN:   1. D/C Home   2.      Current Discharge Medication List              START taking these medications  Details        methylPREDNISolone (Medrol, Pak,) 4 mg tablet  Take with food.   Qty: 1 Dose Pack, Refills:  0   Start date: 10/05/2020               amoxicillin 500 mg tab  Take 500 mg by mouth three (3) times daily for 10 days.   Qty: 30 Tablet, Refills:  0   Start date: 10/05/2020, End date:  10/15/2020                     STOP taking these medications                  butalbitaL-acetaminophen 25-325 mg tab  Comments:    Reason for Stopping:                      metoclopramide HCl (Reglan) 10 mg tablet  Comments:    Reason for Stopping:                      ketorolac (TORADOL) 10 mg tablet  Comments:    Reason for Stopping:                          3.      Follow-up Information               Follow up With  Specialties  Details  Why  Contact Info              [facility]        [phone]               [facility] EMERGENCY DEPT  Emergency Medicine      [address]   [place] [address]   [phone]             _______________________________      Attestations:   This note is prepared by Cassandria Anger, PA-C.   ________      Please note that this dictation was completed with Dragon, the computer voice recognition software.  Quite often unanticipated grammatical, syntax, homophones, and other interpretive errors are  inadvertently transcribed by the computer software.  Please disregard these errors.  Please excuse any errors that have escaped final proofreading.

## 2021-01-21 ENCOUNTER — Other Ambulatory Visit: Payer: Self-pay

## 2021-01-21 ENCOUNTER — Emergency Department (INDEPENDENT_AMBULATORY_CARE_PROVIDER_SITE_OTHER)
Admission: EM | Admit: 2021-01-21 | Discharge: 2021-01-21 | Disposition: A | Discharge status: Home / Self Care | Source: Home / Self Care | Attending: Emergency Medicine | Admitting: Emergency Medicine

## 2021-01-21 ENCOUNTER — Encounter: Payer: Self-pay | Admitting: Emergency Medicine

## 2021-01-21 DIAGNOSIS — R1030 Lower abdominal pain, unspecified: Secondary | ICD-10-CM | POA: Diagnosis not present

## 2021-01-21 DIAGNOSIS — K921 Melena: Secondary | ICD-10-CM

## 2021-01-21 LAB — POCT URINALYSIS DIP (MANUAL ENTRY)
Blood, UA: NEGATIVE
Glucose, UA: NEGATIVE mg/dL
Leukocytes, UA: NEGATIVE
Nitrite, UA: NEGATIVE
Spec Grav, UA: >=1.03 — AB (ref 1.010–1.025)
Urobilinogen, UA: 0.2 E.U./dL
pH, UA: 6 (ref 5.0–8.0)

## 2021-01-21 LAB — POC HEMOCCULT BLD/STL (OFFICE/1-CARD/DIAGNOSTIC): Fecal Occult Blood, POC: NEGATIVE

## 2021-01-21 LAB — POCT URINE PREGNANCY: Preg Test, Ur: NEGATIVE

## 2021-01-21 MED ORDER — ONDANSETRON 4 MG PO TBDP: 4.0000 mg | ORAL_TABLET | Freq: Three times a day (TID) | ORAL | 0 refills | Status: AC | PRN

## 2021-01-21 NOTE — Discharge Instructions (Addendum)
May take 1000 mg Tylenol 3-4 times a day as needed for pain.  I am sending off a CBC.  I will contact you only if it is abnormal and you need to go to the hospital for blood transfusion.  Follow-up with GI if you are still having symptoms tomorrow.  I have placed a referral.  Otherwise, follow-up with a primary care provider of your choice.  I have placed referrals for GI and for assistance in finding a primary care provider.  Zofran as needed for nausea.  Push electrolyte containing fluids such as Pedialyte.  Your urine shows that you are dehydrated.

## 2021-01-21 NOTE — ED Triage Notes (Addendum)
Diarrhea started at 0100- last time at 0900 Diarrhea x 3 - black the 1st 2 times, lighter on 3rd Hx of stomach issues in past - in the process of being diagnosed  C/o Heartburn - no meds this am Denies nausea  COVID vaccine x 2

## 2021-01-21 NOTE — ED Provider Notes (Addendum)
HPI  SUBJECTIVE:  Natasha Good is a 25 y.o. female who presents with intermittent, nonmigratory, nonradiating, hours long crampy lower abdominal pain starting last night.  She reports the urge to defecate and 3 episodes of melena.  She reports nausea.  No fevers, vomiting, abdominal distention.  She does not take any NSAIDs on a regular basis.  No vaginal odor, bleeding, discharge.  No urinary complaints.  No recent Pepto-Bismol use, excess intake of iron rich foods or iron supplements.  No antipyretic in the past 6 hours.  Car ride over here was not painful.  She has never had symptoms like this before.  She tried Tylenol without improvement in her symptoms.  No bowel movement makes the abdominal pain better, movement makes it worse.  No shortness of breath, lightheadedness, dizziness, syncope.  No questionable leftovers, recent travel, raw or undercooked foods.  She has a past medical history of "stomach problems" including bloating and decreased appetite which is been worked up by her primary care provider at the [place] in [place].  She has not yet been seen by GI.  No history of peptic ulcer disease, colitis, ulcerative colitis, Crohn's, abdominal surgeries, diabetes, hypertension, coagulopathy, antiplatelet/anticoagulant use, anemia.  LMP: 4/1.  PMD: None.    Past Medical History:  Diagnosis Date  . Acid reflux   . Anemia   . Anxiety   . Ovarian cyst     Past Surgical History:  Procedure Laterality Date  . WISDOM TOOTH EXTRACTION      Family History  Problem Relation Age of Onset  . Heart disease Father     Social History   Tobacco Use  . Smoking status: Never Smoker  . Smokeless tobacco: Never Used  Substance Use Topics  . Alcohol use: No  . Drug use: No    No current facility-administered medications for this encounter.  Current Outpatient Medications:  .  ondansetron (ZOFRAN ODT) 4 MG disintegrating tablet, Take 1 tablet (4 mg total) by mouth every 8 (eight) hours as  needed for nausea or vomiting., Disp: 20 tablet, Rfl: 0 .  acetaminophen (TYLENOL) 325 MG tablet, Take 650 mg by mouth every 6 (six) hours as needed for mild pain., Disp: , Rfl:   No Known Allergies   ROS  As noted in HPI.   Physical Exam  BP 123/79 (BP Location: Right Arm)   Pulse 74   Temp 98.5 F (36.9 C) (Oral)   Resp 16   LMP 01/15/2021 (Exact Date)   SpO2 100%   Breastfeeding No   Constitutional: Well developed, well nourished, no acute distress Eyes:  EOMI, conjunctiva normal bilaterally HENT: Normocephalic, atraumatic,mucus membranes moist Respiratory: Normal inspiratory effort Cardiovascular: Normal rate GI: nondistended normal appearance, soft, mild periumbilical tenderness, no guarding, rebound.  No pulsatile masses.  Active bowel sounds. Rectal: No blood or stool on glove.  Hemoccult negative skin: No rash, skin intact Musculoskeletal: no deformities Neurologic: Alert & oriented x 3, no focal neuro deficits Psychiatric: Speech and behavior appropriate   ED Course   Medications - No data to display  Orders Placed This Encounter  Procedures  . Ambulatory referral to Gastroenterology    Referral Priority:   Routine    Referral Type:   Consultation    Referral Reason:   Specialty Services Required    Number of Visits Requested:   1  . POCT urinalysis dipstick    Standing Status:   Standing    Number of Occurrences:   1  . POCT  urine pregnancy    Standing Status:   Standing    Number of Occurrences:   1  . POC Hemoccult Bld/Stl (1 card done in Office)    Standing Status:   Standing    Number of Occurrences:   1    No results found for this or any previous visit (from the past 24 hour(s)). No results found.  ED Clinical Impression  1. Melena   2. Lower abdominal pain      ED Assessment/Plan  Patient not pregnant.  Urine is suggestive of dehydration.  She does not have a urinary tract infection. Pt abd exam is benign, no peritoneal signs. No  evidence of surgical abd. Doubt SBO, mesenteric ischemia, appendicitis, hepatitis, cholecystitis, pancreatitis, or perforated viscus.     She appears stable, but will send off a CBC for baseline.  Her Hemoccult is negative.  Will place a GI referral for her to follow-up with if she is still having symptoms tomorrow.  We will have her follow-up with primary care here at [facility] and put an order for assistance in finding a PMD.  Home with Zofran, Tylenol.  No NSAIDs as it increases the risk of bleeding.  ER return precautions given   Discussed labs, imaging, MDM, treatment plan, and plan for follow-up with patient. Discussed sn/sx that should prompt return to the ED. patient agrees with plan.   Addendum 01/23/21 0753: CBC reviewed.  Normal H&H.  Meds ordered this encounter  Medications  . ondansetron (ZOFRAN ODT) 4 MG disintegrating tablet    Sig: Take 1 tablet (4 mg total) by mouth every 8 (eight) hours as needed for nausea or vomiting.    Dispense:  20 tablet    Refill:  0      *This clinic note was created using Scientist, clinical (histocompatibility and immunogenetics). Therefore, there may be occasional mistakes despite careful proofreading.  ?    Domenick Gong, MD 01/21/21 1348    Domenick Gong, MD 01/23/21 [phone]

## 2021-01-22 LAB — CBC
HCT: 39.1 % (ref 35.0–45.0)
Hemoglobin: 13.2 g/dL (ref 11.7–15.5)
MCH: 30.2 pg (ref 27.0–33.0)
MCHC: 33.8 g/dL (ref 32.0–36.0)
MCV: 89.5 fL (ref 80.0–100.0)
MPV: 11.1 fL (ref 7.5–12.5)
Platelets: 269 10*3/uL (ref 140–400)
RBC: 4.37 10*6/uL (ref 3.80–5.10)
RDW: 12.6 % (ref 11.0–15.0)
WBC: 6.4 10*3/uL (ref 3.8–10.8)

## 2021-02-19 ENCOUNTER — Ambulatory Visit: Admitting: Gastroenterology

## 2021-02-22 ENCOUNTER — Ambulatory Visit: Admitting: Gastroenterology

## 2021-04-05 ENCOUNTER — Ambulatory Visit: Admitting: Gastroenterology

## 2021-04-08 ENCOUNTER — Other Ambulatory Visit: Payer: Self-pay

## 2021-04-08 ENCOUNTER — Emergency Department
Admission: EM | Admit: 2021-04-08 | Discharge: 2021-04-08 | Discharge status: Against Medical Advice | Payer: Self-pay | Source: Home / Self Care

## 2021-04-08 DIAGNOSIS — R300 Dysuria: Secondary | ICD-10-CM

## 2021-05-19 ENCOUNTER — Emergency Department (HOSPITAL_COMMUNITY)
Admission: EM | Admit: 2021-05-19 | Discharge: 2021-05-20 | Disposition: A | Discharge status: Home / Self Care | Payer: Federal, State, Local not specified - PPO | Attending: Emergency Medicine | Admitting: Emergency Medicine

## 2021-05-19 ENCOUNTER — Other Ambulatory Visit: Payer: Self-pay

## 2021-05-19 ENCOUNTER — Encounter (HOSPITAL_COMMUNITY): Payer: Self-pay

## 2021-05-19 ENCOUNTER — Emergency Department (HOSPITAL_COMMUNITY): Payer: Federal, State, Local not specified - PPO

## 2021-05-19 DIAGNOSIS — N9489 Other specified conditions associated with female genital organs and menstrual cycle: Secondary | ICD-10-CM | POA: Insufficient documentation

## 2021-05-19 DIAGNOSIS — R9431 Abnormal electrocardiogram [ECG] [EKG]: Secondary | ICD-10-CM | POA: Diagnosis not present

## 2021-05-19 DIAGNOSIS — R0602 Shortness of breath: Secondary | ICD-10-CM | POA: Diagnosis not present

## 2021-05-19 DIAGNOSIS — R000 Tachycardia, unspecified: Secondary | ICD-10-CM | POA: Diagnosis not present

## 2021-05-19 DIAGNOSIS — R42 Dizziness and giddiness: Secondary | ICD-10-CM | POA: Diagnosis not present

## 2021-05-19 DIAGNOSIS — Z20822 Contact with and (suspected) exposure to covid-19: Secondary | ICD-10-CM | POA: Insufficient documentation

## 2021-05-19 DIAGNOSIS — R002 Palpitations: Secondary | ICD-10-CM | POA: Insufficient documentation

## 2021-05-19 DIAGNOSIS — R0789 Other chest pain: Secondary | ICD-10-CM | POA: Insufficient documentation

## 2021-05-19 LAB — CBC WITH DIFFERENTIAL/PLATELET
Abs Immature Granulocytes: 0.02 10*3/uL (ref 0.00–0.07)
Basophils Absolute: 0.1 10*3/uL (ref 0.0–0.1)
Basophils Relative: 1 %
Eosinophils Absolute: 0.1 10*3/uL (ref 0.0–0.5)
Eosinophils Relative: 2 %
HCT: 41.1 % (ref 36.0–46.0)
Hemoglobin: 13.3 g/dL (ref 12.0–15.0)
Immature Granulocytes: 0 %
Lymphocytes Relative: 38 %
Lymphs Abs: 2.3 10*3/uL (ref 0.7–4.0)
MCH: 29.1 pg (ref 26.0–34.0)
MCHC: 32.4 g/dL (ref 30.0–36.0)
MCV: 89.9 fL (ref 80.0–100.0)
Monocytes Absolute: 0.4 10*3/uL (ref 0.1–1.0)
Monocytes Relative: 7 %
Neutro Abs: 3.1 10*3/uL (ref 1.7–7.7)
Neutrophils Relative %: 52 %
Platelets: 271 10*3/uL (ref 150–400)
RBC: 4.57 MIL/uL (ref 3.87–5.11)
RDW: 13.6 % (ref 11.5–15.5)
WBC: 6.1 10*3/uL (ref 4.0–10.5)
nRBC: 0 % (ref 0.0–0.2)

## 2021-05-19 LAB — BASIC METABOLIC PANEL
Anion gap: 10 (ref 5–15)
BUN: 14 mg/dL (ref 6–20)
CO2: 21 mmol/L — ABNORMAL LOW (ref 22–32)
Calcium: 9.1 mg/dL (ref 8.9–10.3)
Chloride: 103 mmol/L (ref 98–111)
Creatinine, Ser: 0.9 mg/dL (ref 0.44–1.00)
GFR, Estimated: >60 mL/min (ref >60)
Glucose, Bld: 99 mg/dL (ref 70–99)
Potassium: 3.5 mmol/L (ref 3.5–5.1)
Sodium: 134 mmol/L — ABNORMAL LOW (ref 135–145)

## 2021-05-19 LAB — URINALYSIS, ROUTINE W REFLEX MICROSCOPIC
Bacteria, UA: NONE SEEN
Bilirubin Urine: NEGATIVE
Glucose, UA: NEGATIVE mg/dL
Hgb urine dipstick: NEGATIVE
Ketones, ur: 80 mg/dL — AB
Nitrite: NEGATIVE
Protein, ur: 30 mg/dL — AB
Specific Gravity, Urine: 1.032 — ABNORMAL HIGH (ref 1.005–1.030)
pH: 6 (ref 5.0–8.0)

## 2021-05-19 LAB — I-STAT BETA HCG BLOOD, ED (MC, WL, AP ONLY): I-stat hCG, quantitative: <5 m[IU]/mL (ref <5)

## 2021-05-19 LAB — RESP PANEL BY RT-PCR (FLU A&B, COVID) ARPGX2
Influenza A by PCR: NEGATIVE
Influenza B by PCR: NEGATIVE
SARS Coronavirus 2 by RT PCR: NEGATIVE

## 2021-05-19 LAB — TROPONIN I (HIGH SENSITIVITY)
Troponin I (High Sensitivity): <2 ng/L (ref <18)
Troponin I (High Sensitivity): <2 ng/L (ref <18)

## 2021-05-19 MED ORDER — OXYCODONE HCL 5 MG PO TABS
10.0000 mg | ORAL_TABLET | Freq: Once | ORAL | Status: AC
  Administered 2021-05-19: 10 mg via ORAL
  Filled 2021-05-19: qty 2

## 2021-05-19 MED ORDER — IOHEXOL 350 MG/ML SOLN
100.0000 mL | Freq: Once | INTRAVENOUS | Status: AC | PRN
  Administered 2021-05-19: 100 mL via INTRAVENOUS

## 2021-05-19 NOTE — ED Notes (Signed)
Pt reporting that she feels her symptoms are getting worse. Vital signs reassessed at this time.

## 2021-05-19 NOTE — ED Provider Notes (Signed)
[facility]-EMERGENCY DEPT Provider Note   CSN: [id number] Arrival date & time: 05/19/21  1950     History Chief Complaint  Patient presents with   Chest Pain   Shortness of Breath   Dizziness    Natasha Good is a 25 y.o. female presents to the emergency department complaining of chest pain, palpitations and shortness of breath onset approximately 3 days and acutely worsening.  Patient states she has moderate pain under her left breast that radiates into her left upper chest.  Reports she has associated shortness of breath and palpitations.  Feels lightheaded when she walks.  Chest pain is not exertional but shortness of breath is.  Does have pain with deep breathing.  Denies history of lupus, previous DVT/PE, anticoagulants, recent travel or surgery, leg swelling, OCP.  Patient denies fevers, chills, nausea, vomiting.  Reports she has had similar episodes before but never this intense.  Recently wore a 10-day Holter monitor but does not have the results back.  She is followed by the [place].  Does have a history of PTSD but denies anxiety symptoms at this time.  Denies all caffeine usage including chocolate.  Denies drug and alcohol usage.  The history is provided by the patient and medical records. No language interpreter was used.      Past Medical History:  Diagnosis Date   Acid reflux    Anemia    Anxiety    Ovarian cyst     Patient Active Problem List   Diagnosis Date Noted   Indication for care in labor or delivery 02/26/2017   Abnormal fetal ultrasound 02/23/2017   Fetal arrhythmia affecting pregnancy, antepartum 02/23/2017   Rh negative state in antepartum period 02/23/2017    Past Surgical History:  Procedure Laterality Date   WISDOM TOOTH EXTRACTION       OB History     Gravida  1   Para  1   Term  1   Preterm      AB      Living  1      SAB      IAB      Ectopic      Multiple  0   Live Births  1           Family  History  Problem Relation Age of Onset   Heart disease Father     Social History   Tobacco Use   Smoking status: Never   Smokeless tobacco: Never  Substance Use Topics   Alcohol use: No   Drug use: No    Home Medications Prior to Admission medications   Medication Sig Start Date End Date Taking? Authorizing Provider  acetaminophen (TYLENOL) 325 MG tablet Take 650 mg by mouth every 6 (six) hours as needed for mild pain.    [provider]  ondansetron (ZOFRAN ODT) 4 MG disintegrating tablet Take 1 tablet (4 mg total) by mouth every 8 (eight) hours as needed for nausea or vomiting. 01/21/21   Domenick Gong, MD    Allergies    Patient has no known allergies.  Review of Systems   Review of Systems  Constitutional:  Negative for appetite change, diaphoresis, fatigue, fever and unexpected weight change.  HENT:  Negative for mouth sores.   Eyes:  Negative for visual disturbance.  Respiratory:  Positive for shortness of breath. Negative for cough, chest tightness and wheezing.   Cardiovascular:  Positive for chest pain and palpitations.  Gastrointestinal:  Negative  for abdominal pain, constipation, diarrhea, nausea and vomiting.  Endocrine: Negative for polydipsia, polyphagia and polyuria.  Genitourinary:  Negative for dysuria, frequency, hematuria and urgency.  Musculoskeletal:  Negative for back pain and neck stiffness.  Skin:  Negative for rash.  Allergic/Immunologic: Negative for immunocompromised state.  Neurological:  Positive for light-headedness. Negative for dizziness, syncope and headaches.  Hematological:  Does not bruise/bleed easily.  Psychiatric/Behavioral:  Negative for sleep disturbance. The patient is not nervous/anxious.    Physical Exam Updated Vital Signs BP 123/86 (BP Location: Left Arm)   Pulse 97   Temp 98.4 F (36.9 C) (Oral)   Resp 10   Ht 5\' 5"  (1.651 m)   Wt 61.2 kg   LMP 05/12/2021   SpO2 98%   BMI 22.47 kg/m   Physical  Exam Vitals and nursing note reviewed.  Constitutional:      General: She is not in acute distress.    Appearance: She is not diaphoretic.  HENT:     Head: Normocephalic.  Eyes:     General: No scleral icterus.    Conjunctiva/sclera: Conjunctivae normal.  Cardiovascular:     Rate and Rhythm: Regular rhythm. Tachycardia present.     Pulses: Normal pulses.          Radial pulses are 2+ on the right side and 2+ on the left side.     Comments: Tachycardic to the 120s Pulmonary:     Effort: Pulmonary effort is normal. No tachypnea, accessory muscle usage, prolonged expiration, respiratory distress or retractions.     Breath sounds: Normal breath sounds. No stridor.     Comments: Equal chest rise. No increased work of breathing. Abdominal:     General: There is no distension.     Palpations: Abdomen is soft.     Tenderness: There is no abdominal tenderness. There is no guarding or rebound.  Musculoskeletal:     Cervical back: Normal range of motion.     Comments: Moves all extremities equally and without difficulty.  Skin:    General: Skin is warm and dry.     Capillary Refill: Capillary refill takes less than 2 seconds.  Neurological:     Mental Status: She is alert.     GCS: GCS eye subscore is 4. GCS verbal subscore is 5. GCS motor subscore is 6.     Comments: Speech is clear and goal oriented.  Psychiatric:        Mood and Affect: Mood normal.    ED Results / Procedures / Treatments   Labs (all labs ordered are listed, but only abnormal results are displayed) Labs Reviewed  BASIC METABOLIC PANEL - Abnormal; Notable for the following components:      Result Value   Sodium 134 (*)    CO2 21 (*)    All other components within normal limits  URINALYSIS, ROUTINE W REFLEX MICROSCOPIC - Abnormal; Notable for the following components:   APPearance HAZY (*)    Specific Gravity, Urine 1.032 (*)    Ketones, ur 80 (*)    Protein, ur 30 (*)    Leukocytes,Ua SMALL (*)    All  other components within normal limits  RESP PANEL BY RT-PCR (FLU A&B, COVID) ARPGX2  CBC WITH DIFFERENTIAL/PLATELET  I-STAT BETA HCG BLOOD, ED ([facility], [facility], [facility] ONLY)  TROPONIN I (HIGH SENSITIVITY)  TROPONIN I (HIGH SENSITIVITY)    EKG ED ECG REPORT   Date: 05/20/2021  Rate: 96  Rhythm: sinus arrhythmia  QRS Axis: normal  Intervals: QT prolonged  ST/T Wave abnormalities: nonspecific ST changes  Conduction Disutrbances:none  Narrative Interpretation: Prolonged QT is new, sinus arrhythmia is not new.  Old EKG Reviewed: changes noted  I have personally reviewed the EKG tracing and agree with the computerized printout as noted.    Radiology DG Chest 2 View  Result Date: 05/19/2021 CLINICAL DATA:  Chest pain EXAM: CHEST - 2 VIEW COMPARISON:  05/03/2015 FINDINGS: The heart size and mediastinal contours are within normal limits. Both lungs are clear. The visualized skeletal structures are unremarkable. IMPRESSION: No active cardiopulmonary disease. Electronically Signed   By: Jasmine Pang M.D.   On: 05/19/2021 20:37   CT Angio Chest PE W and/or Wo Contrast  Result Date: 05/19/2021 CLINICAL DATA:  Chest pain, shortness of breath, pulmonary embolus suspected. EXAM: CT ANGIOGRAPHY CHEST WITH CONTRAST TECHNIQUE: Multidetector CT imaging of the chest was performed using the standard protocol during bolus administration of intravenous contrast. Multiplanar CT image reconstructions and MIPs were obtained to evaluate the vascular anatomy. CONTRAST:  OMNIPAQUE IOHEXOL 350 MG/ML SOLN COMPARISON:  Chest x-ray 05/19/2021. FINDINGS: Cardiovascular: Satisfactory opacification of the pulmonary arteries to the segmental level. No evidence of pulmonary embolism. Normal heart size. No pericardial effusion. Mediastinum/Nodes: No enlarged mediastinal, hilar, or axillary lymph nodes. Thyroid gland, trachea, and esophagus demonstrate no significant findings. Lungs/Pleura: No focal consolidation. No pulmonary  nodule. No pulmonary mass. No pleural effusion. No pneumothorax. Upper Abdomen: No acute abnormality. Musculoskeletal: No chest wall abnormality. No suspicious lytic or blastic osseous lesions. No acute displaced fracture. Slightly limited evaluation for acute rib fractures due to motion artifact. Review of the MIP images confirms the above findings. IMPRESSION: 1. No pulmonary embolus. 2. No acute intrathoracic abnormality. Electronically Signed   By: Tish Frederickson M.D.   On: 05/19/2021 23:12    Procedures Procedures   Medications Ordered in ED Medications  iohexol (OMNIPAQUE) 350 MG/ML injection 100 mL (100 mLs Intravenous Contrast Given 05/19/21 2251)  oxyCODONE (Oxy IR/ROXICODONE) immediate release tablet 10 mg (10 mg Oral Given 05/19/21 2315)    ED Course  I have reviewed the triage vital signs and the nursing notes.  Pertinent labs & imaging results that were available during my care of the patient were reviewed by me and considered in my medical decision making (see chart for details).    MDM Rules/Calculators/A&P                           Patient presents with chest pain, shortness of breath, palpitations and lightheadedness.  She is tachycardic to the 120s on my exam at rest.  No calf tenderness or swelling.  Labs are all reassuring.  Will obtain CT scan for pulmonary embolism.  12:16 AM CT scan without PE. Pt HR has improved.  PT advised to avoid QT prolonging agents - Zofran is the only one listed on her medication list. Additional discussion indicates there is likely an anxiety component to this, but I have encouraged her to f/u with cardiology this week.  Also discussed keeping a symptom diary and reasons to return to the ED.     Final Clinical Impression(s) / ED Diagnoses Final diagnoses:  Palpitations  Prolonged Q-T interval on ECG    Rx / DC Orders ED Discharge Orders     None        Royelle Hinchman, Boyd Kerbs 05/20/21 0039    Gerhard Munch, MD 05/21/21  1125

## 2021-05-19 NOTE — ED Triage Notes (Signed)
Pt complains of chest pain, shortness of breath, and dizziness for the past couple of months with today being the worst. Pt reports the chest pain as a tight feeling.

## 2021-05-19 NOTE — ED Notes (Signed)
Patient transported to CT 

## 2021-05-19 NOTE — ED Provider Notes (Signed)
Emergency Medicine Provider Triage Evaluation Note  Natasha Good , a 25 y.o. female  was evaluated in triage.  Pt complains of chest pain, sob, sweats, chills that started a few hours ago.  Review of Systems  Positive: Chest pain, sob, sweats, chills Negative: cough  Physical Exam  BP 119/84 (BP Location: Left Arm)   Pulse (!) 103   Temp 99.3 F (37.4 C) (Oral)   Resp 20   Ht 5\' 5"  (1.651 m)   Wt 61.2 kg   SpO2 100%   BMI 22.47 kg/m  Gen:   Awake, no distress   Resp:  Normal effort  MSK:   Moves extremities without difficulty  Other:  Lungs ctab, heart rrr  Medical Decision Making  Medically screening exam initiated at 8:24 PM.  Appropriate orders placed.  Natasha Good was informed that the remainder of the evaluation will be completed by another provider, this initial triage assessment does not replace that evaluation, and the importance of remaining in the ED until their evaluation is complete.     Carolin Sicks 05/19/21 2025    07/19/21, MD 05/19/21 [phone]

## 2021-05-20 NOTE — Discharge Instructions (Signed)
1. Medications: usual home medications 2. Treatment: rest, drink plenty of fluids 3. Follow Up: Please followup with your primary doctor in 2-3 days for discussion of your diagnoses and further evaluation after today's visit; if you do not have a primary care doctor use the resource guide provided to find one; Please return to the ER for new or worsening symptoms  Began a daily diary of sleep, medications, food intake and stressors to help determine what may be triggering palpitations, anxiety and panic attacks  The Anatomy of Anxiety: Understanding and Overcoming the Body's Fear Response by Dr. Carroll Sage

## 2023-03-09 IMAGING — CT CT ANGIO CHEST
2 of 6 series · 18 of 36 positions shown · IV contrast (omnipaque)
Comparison: Chest x-ray 05/19/2021.

CLINICAL DATA: Chest pain, shortness of breath, pulmonary embolus
suspected.

EXAM:
CT ANGIOGRAPHY CHEST WITH CONTRAST
TECHNIQUE: Multidetector CT imaging of the chest was performed using the
standard protocol during bolus administration of intravenous
contrast. Multiplanar CT image reconstructions and MIPs were
obtained to evaluate the vascular anatomy.
CONTRAST:  100mL OMNIPAQUE IOHEXOL 350 MG/ML SOLN

[Series 5: thins · axial · 0.64mm/px · z∈[-304,-34]mm · 17 of 304 slices shown]
[im 17/304  lung]
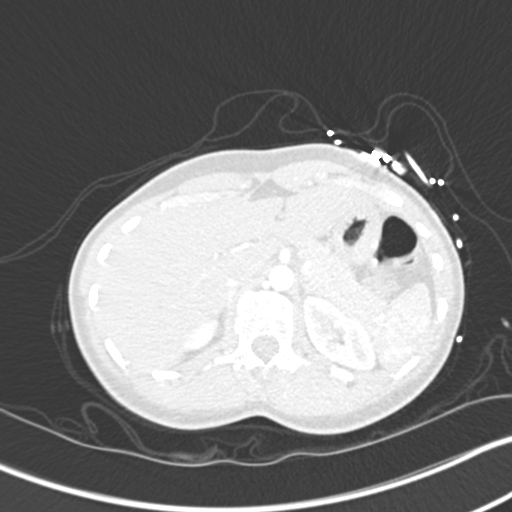
[im 34/304  mediastinal]
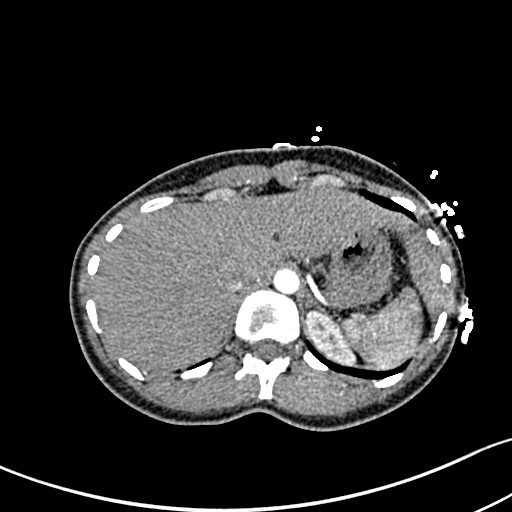
[im 51/304  lung]
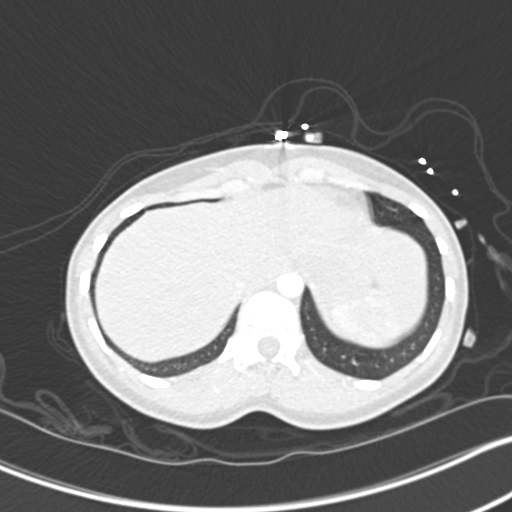
[im 68/304  mediastinal]
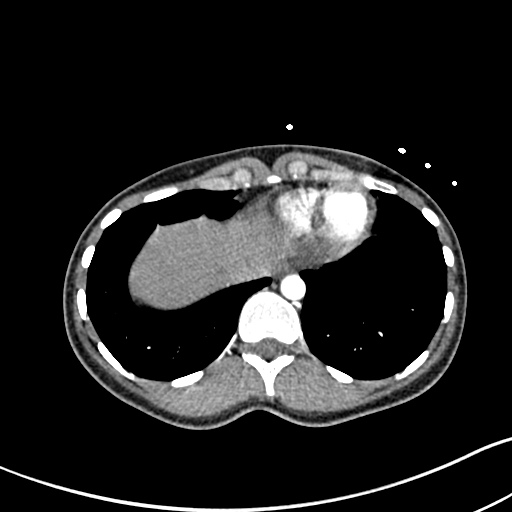
[im 85/304  lung]
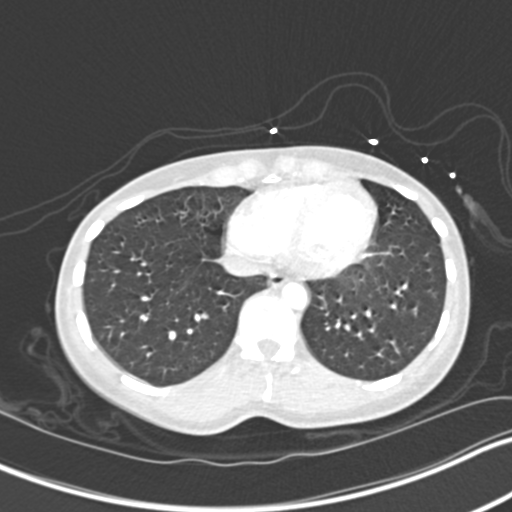
[im 102/304  mediastinal]
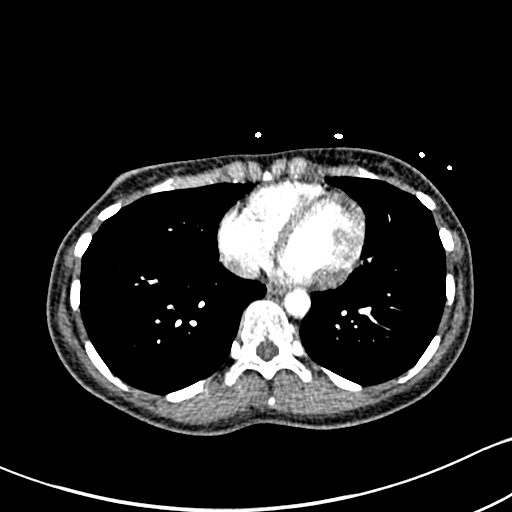
[im 118/304  lung]
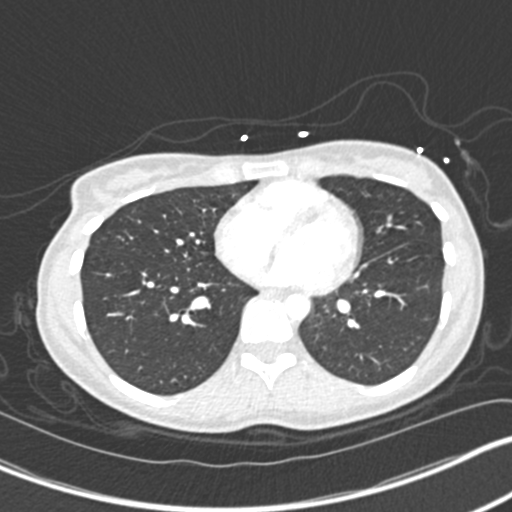
[im 135/304  mediastinal]
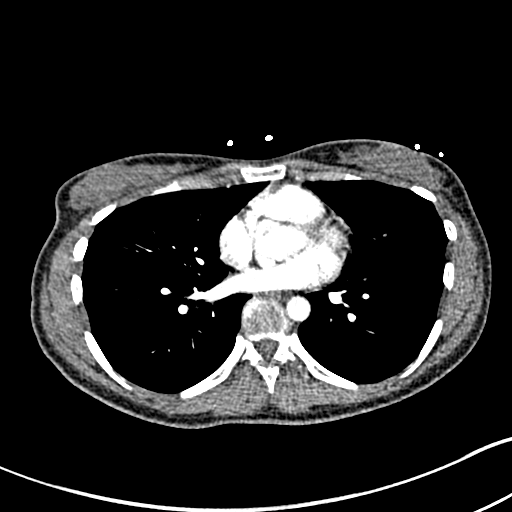
[im 152/304  lung]
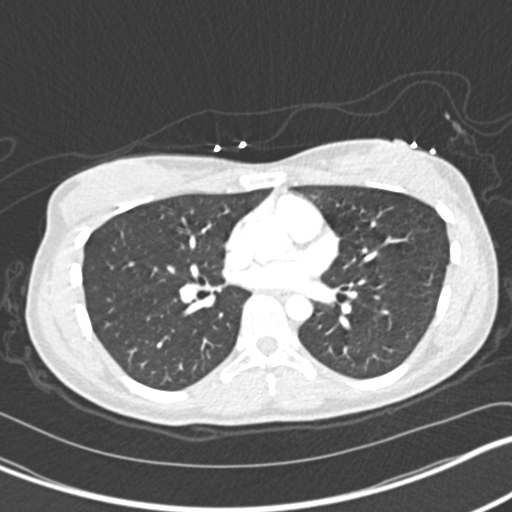
[im 169/304  mediastinal]
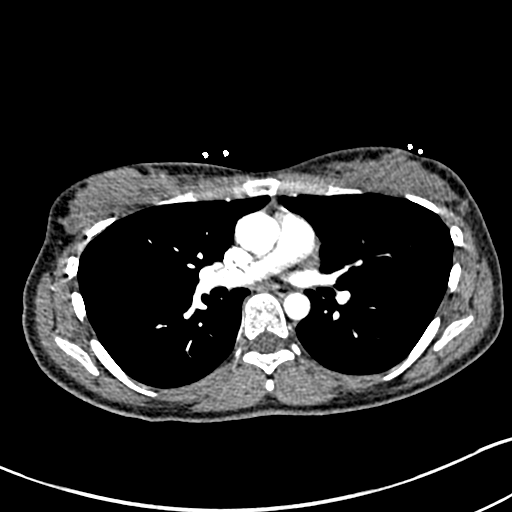
[im 186/304  lung]
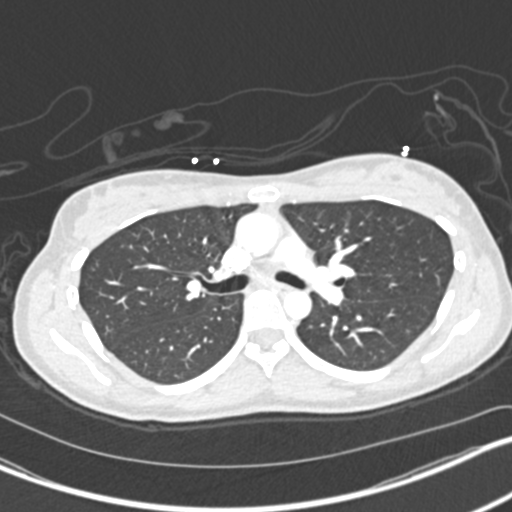
[im 203/304  mediastinal]
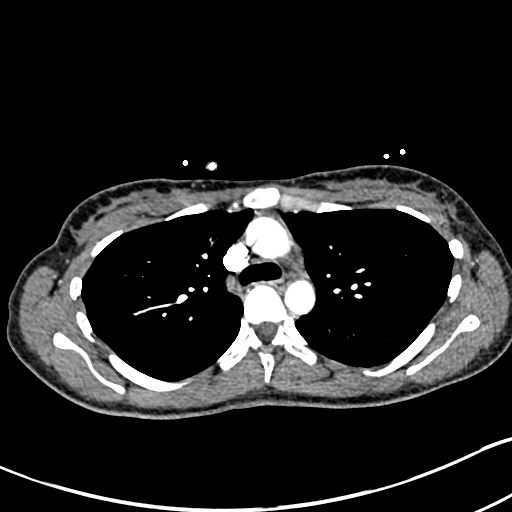
[im 219/304  lung]
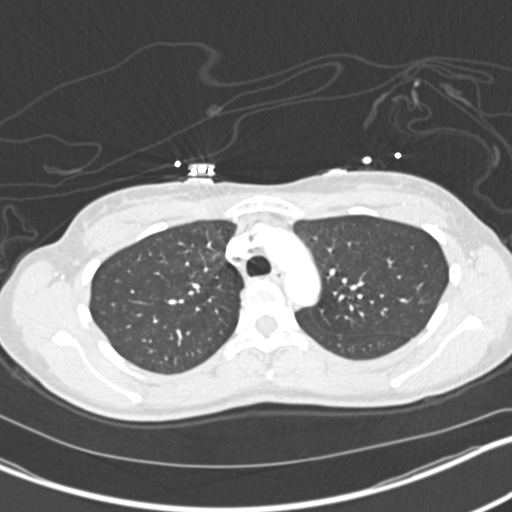
[im 236/304  mediastinal]
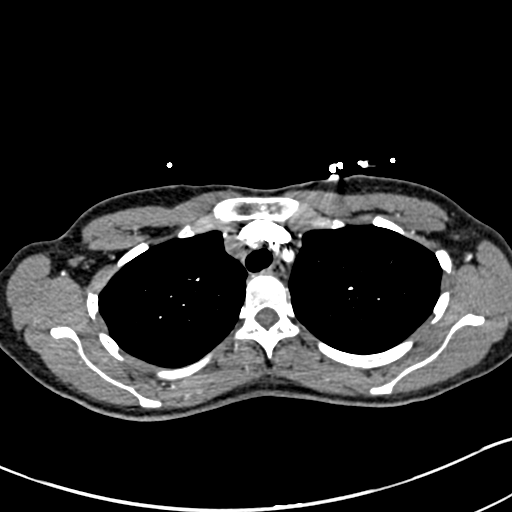
[im 253/304  lung]
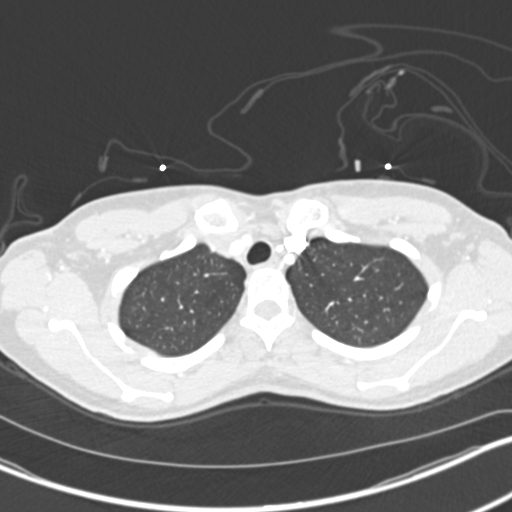
[im 270/304  mediastinal]
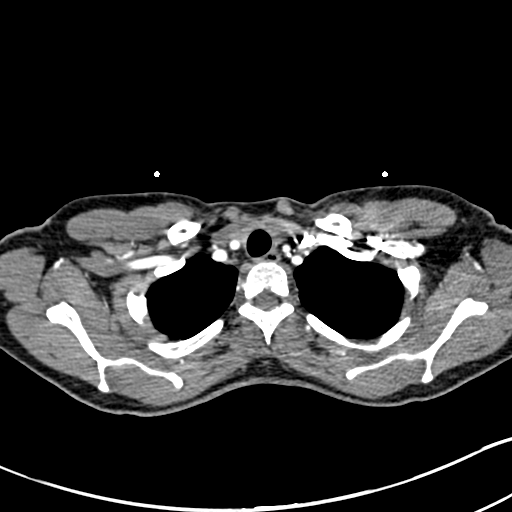
[im 287/304  lung]
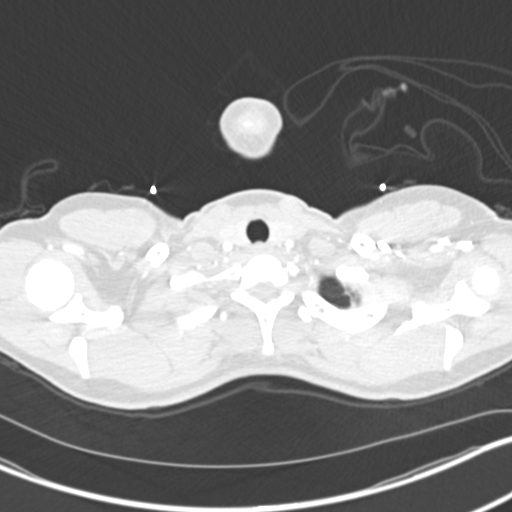

[Series 6: coronal mpr · coronal · 0.61mm/px · 1 of 93 slices shown]
[im 47/93  mediastinal]
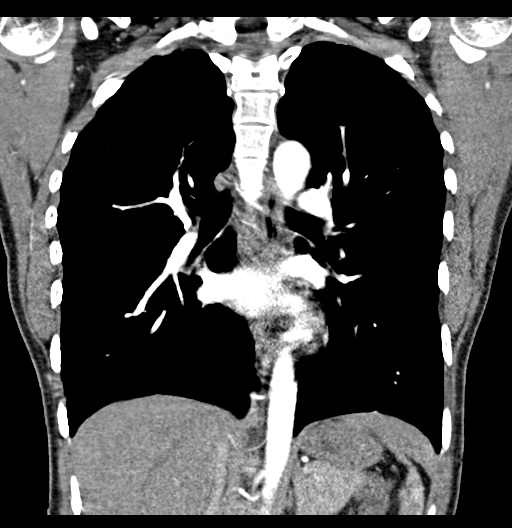

[18 of 36 positions shown; findings below may reference images not displayed]

FINDINGS: Cardiovascular: Satisfactory opacification of the pulmonary arteries
to the segmental level. No evidence of pulmonary embolism. Normal
heart size. No pericardial effusion.

Mediastinum/Nodes: No enlarged mediastinal, hilar, or axillary lymph
nodes. Thyroid gland, trachea, and esophagus demonstrate no
significant findings.

Lungs/Pleura: No focal consolidation. No pulmonary nodule. No
pulmonary mass. No pleural effusion. No pneumothorax.

Upper Abdomen: No acute abnormality.

Musculoskeletal:

No chest wall abnormality.

No suspicious lytic or blastic osseous lesions. No acute displaced
fracture. Slightly limited evaluation for acute rib fractures due to
motion artifact.

Review of the MIP images confirms the above findings.
IMPRESSION: 1. No pulmonary embolus.
2. No acute intrathoracic abnormality.
